# Patient Record
Sex: Female | Born: 1990 | Race: Black or African American | Hispanic: No | Smoking: Former smoker
Health system: Southern US, Community
[De-identification: ages and names within clinical notes are randomized; demographics above are authoritative.]

## PROBLEM LIST (undated history)

## (undated) DIAGNOSIS — A549 Gonococcal infection, unspecified: Secondary | ICD-10-CM

## (undated) DIAGNOSIS — F32A Depression, unspecified: Secondary | ICD-10-CM

## (undated) DIAGNOSIS — A599 Trichomoniasis, unspecified: Secondary | ICD-10-CM

## (undated) DIAGNOSIS — A6000 Herpesviral infection of urogenital system, unspecified: Secondary | ICD-10-CM

## (undated) DIAGNOSIS — B9689 Other specified bacterial agents as the cause of diseases classified elsewhere: Secondary | ICD-10-CM

## (undated) DIAGNOSIS — N760 Acute vaginitis: Secondary | ICD-10-CM

## (undated) DIAGNOSIS — A749 Chlamydial infection, unspecified: Secondary | ICD-10-CM

## (undated) DIAGNOSIS — N739 Female pelvic inflammatory disease, unspecified: Secondary | ICD-10-CM

## (undated) DIAGNOSIS — N83209 Unspecified ovarian cyst, unspecified side: Secondary | ICD-10-CM

## (undated) DIAGNOSIS — F329 Major depressive disorder, single episode, unspecified: Secondary | ICD-10-CM

---
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
## 2010-12-10 LAB — URINALYSIS, ROUTINE W REFLEX MICROSCOPIC
Glucose, UA: NEGATIVE mg/dL
Specific Gravity, Urine: >1.03 — ABNORMAL HIGH (ref 1.005–1.030)
pH: 5.5 (ref 5.0–8.0)

## 2010-12-10 LAB — POCT URINALYSIS DIP (DEVICE)
Ketones, ur: 40 mg/dL — AB
Protein, ur: NEGATIVE mg/dL
Specific Gravity, Urine: 1.025 (ref 1.005–1.030)

## 2010-12-10 LAB — POCT PREGNANCY, URINE
Preg Test, Ur: NEGATIVE
Preg Test, Ur: NEGATIVE

## 2011-01-12 NOTE — Group Therapy Note (Signed)
Debra Boyd, Debra Boyd           ACCOUNT NO.:  [id number]   MEDICAL RECORD NO.:  [id number]          PATIENT TYPE:  WOC   LOCATION:  [facility]                   FACILITY:  [facility]   PHYSICIAN:  Tinnie Gens, MD        DATE OF BIRTH:  1991-01-12   DATE OF SERVICE:  11/23/2007                                  CLINIC NOTE   CHIEF COMPLAINT:  Annual exam.   HISTORY OF PRESENT ILLNESS:  This is a 20 year old female who recently  moved here from [place].  She is self-referred and states that she  needs to start getting gynecological exams.  She has been sexually  active for almost 3 years.  She states she has had 3 partners since she  started having sex.  She is not currently having sex.  She says she uses  condoms most of the time.  She is also interested in birth control pills  today.  She has tried Depo in the past and does not want to try again  secondary to weight gain.  She is interested in oral contraceptive  pills, declines Mirena or Implanon.   OB HISTORY:  No prior pregnancy.   GYN HISTORY:  No history of sexually transmitted diseases.  She has not  had Gardasil.   SOCIAL HISTORY:  1. She does not smoke.  2. She just moved here with her mother.  3. She states she is joining the [employer] this summer.   FAMILY HISTORY:  No known history of ovarian cancer, uterine cancer or  breast cancer.   MEDS:  She is not currently taking any.   PAST MEDICAL HISTORY:  None.   ALLERGIES:  None.   PHYSICAL EXAM:  GENERAL:  She is alert, pleasant, in no acute distress.  RESPIRATORY:  Clear to auscultation bilaterally.  CARDIOVASCULAR:  Regular rate and rhythm, no murmurs, rubs or gallops.  ABDOMEN:  Soft, nontender, positive bowel sounds.  PELVIC:  Normal vaginal mucosa.  Cervix is clean, no discharge.   ASSESSMENT AND PLAN:  A 20 year old female here for yearly exam.  1. Gave prescription for Ortho Tri-Cyclen.  2. Advised starting Gardasil.  Patient wants to go home and talk to  her mother first.  3. Advised getting a regular primary care physician in the area.  4. Counseled heavily on using condoms with each sexual encounter.     ______________________________  Reino Bellis    ______________________________  Tinnie Gens, MD    TA/MEDQ  D:  11/23/2007  T:  11/23/2007  Job:  [id number]

## 2011-01-12 NOTE — Group Therapy Note (Signed)
Debra Boyd, Debra Boyd           ACCOUNT NO.:  [id number]   MEDICAL RECORD NO.:  [id number]          PATIENT TYPE:  WOC   LOCATION:  [facility]                   FACILITY:  [facility]   PHYSICIAN:  Allie Bossier, MD        DATE OF BIRTH:  November 03, 1990   DATE OF SERVICE:  12/26/2008                                  CLINIC NOTE   Crystel is an 20 year old single, black, gravida 0 who is here for her  annual exam.  She has no particular complaints today.  She declines  birth control.  She says that she uses condoms sometimes and that her  boyfriend is resistant to use them.   PAST MEDICAL HISTORY:  She has none.   PAST SURGICAL HISTORY:  None.   SOCIAL HISTORY:  She smokes 3 blunts per day.  She denies tobacco or  alcohol or other drug use.   ALLERGIES:  No known drug allergies.  No latex allergy.   REVIEW OF SYSTEMS:  She lives with her mother.  She is graduating from  [employer] in June of this year.  She has  already enlisted in  [employer] and plans to leave August 9.  She uses condoms sometimes.  Her  Pap smear done last year was normal.   PHYSICAL EXAMINATION:  GENERAL:  Well-nourished, well-hydrated black  female in no apparent distress.  VITAL SIGNS:  Weight 122 pounds, height 5 feet 3, blood pressure 105/69,  pulse 68.  HEENT:  Normal.  BREASTS:  Normal bilaterally.  HEART:  Regular rate and rhythm.  ABDOMEN:  Benign.  No hepatosplenomegaly.  EXTERNAL GENITALIA:  No lesions.  Uterus normal size and shape  anteverted and mobile.  Adnexa nontender.  No masses.   ASSESSMENT/PLAN:  Annual exam.  I did not check a Pap smear due to the  new __________ recommendations.  I did check GC and Chlamydia cultures.  I have strongly advised regular  condom use if she is not using any other form of birth control.    Odetta Pink, MD     MCD/MEDQ  D:  12/26/2008  T:  12/26/2008  Job:  [id number]

## 2011-05-31 LAB — GC/CHLAMYDIA PROBE AMP, GENITAL
Chlamydia, DNA Probe: POSITIVE — AB
GC Probe Amp, Genital: POSITIVE — AB

## 2011-05-31 LAB — POCT PREGNANCY, URINE: Preg Test, Ur: NEGATIVE

## 2011-05-31 LAB — POCT URINALYSIS DIP (DEVICE)
Glucose, UA: NEGATIVE
Nitrite: NEGATIVE
Operator id: 303351
Urobilinogen, UA: 0.2

## 2012-08-19 ENCOUNTER — Encounter (HOSPITAL_COMMUNITY): Payer: Self-pay | Admitting: *Deleted

## 2012-08-19 ENCOUNTER — Emergency Department (HOSPITAL_COMMUNITY)
Admission: EM | Admit: 2012-08-19 | Discharge: 2012-08-19 | Disposition: A | Discharge status: Nursing Home (Includes ICF) | Payer: Self-pay | Source: Home / Self Care | Attending: Emergency Medicine | Admitting: Emergency Medicine

## 2012-08-19 ENCOUNTER — Encounter (HOSPITAL_COMMUNITY): Payer: Self-pay | Admitting: Physical Medicine and Rehabilitation

## 2012-08-19 ENCOUNTER — Emergency Department (HOSPITAL_COMMUNITY): Payer: Medicaid Other

## 2012-08-19 ENCOUNTER — Other Ambulatory Visit (HOSPITAL_COMMUNITY)
Admission: RE | Admit: 2012-08-19 | Discharge: 2012-08-19 | Disposition: A | Discharge status: Home / Self Care | Payer: Medicaid Other | Source: Ambulatory Visit | Attending: Emergency Medicine | Admitting: Emergency Medicine

## 2012-08-19 ENCOUNTER — Emergency Department (HOSPITAL_COMMUNITY)
Admission: EM | Admit: 2012-08-19 | Discharge: 2012-08-20 | Disposition: A | Discharge status: Home / Self Care | Payer: Medicaid Other | Attending: Emergency Medicine | Admitting: Emergency Medicine

## 2012-08-19 DIAGNOSIS — N739 Female pelvic inflammatory disease, unspecified: Secondary | ICD-10-CM | POA: Insufficient documentation

## 2012-08-19 DIAGNOSIS — R110 Nausea: Secondary | ICD-10-CM | POA: Insufficient documentation

## 2012-08-19 DIAGNOSIS — F17200 Nicotine dependence, unspecified, uncomplicated: Secondary | ICD-10-CM | POA: Insufficient documentation

## 2012-08-19 DIAGNOSIS — B9689 Other specified bacterial agents as the cause of diseases classified elsewhere: Secondary | ICD-10-CM | POA: Insufficient documentation

## 2012-08-19 DIAGNOSIS — N730 Acute parametritis and pelvic cellulitis: Secondary | ICD-10-CM

## 2012-08-19 DIAGNOSIS — Z113 Encounter for screening for infections with a predominantly sexual mode of transmission: Secondary | ICD-10-CM | POA: Insufficient documentation

## 2012-08-19 DIAGNOSIS — R1031 Right lower quadrant pain: Secondary | ICD-10-CM

## 2012-08-19 DIAGNOSIS — D72829 Elevated white blood cell count, unspecified: Secondary | ICD-10-CM | POA: Insufficient documentation

## 2012-08-19 DIAGNOSIS — A499 Bacterial infection, unspecified: Secondary | ICD-10-CM | POA: Insufficient documentation

## 2012-08-19 DIAGNOSIS — Z8619 Personal history of other infectious and parasitic diseases: Secondary | ICD-10-CM | POA: Insufficient documentation

## 2012-08-19 DIAGNOSIS — Z3202 Encounter for pregnancy test, result negative: Secondary | ICD-10-CM | POA: Insufficient documentation

## 2012-08-19 DIAGNOSIS — N760 Acute vaginitis: Secondary | ICD-10-CM | POA: Insufficient documentation

## 2012-08-19 LAB — POCT URINALYSIS DIP (DEVICE)
Bilirubin Urine: NEGATIVE
Ketones, ur: NEGATIVE mg/dL
Protein, ur: NEGATIVE mg/dL
Specific Gravity, Urine: 1.025 (ref 1.005–1.030)
pH: 6 (ref 5.0–8.0)

## 2012-08-19 LAB — BASIC METABOLIC PANEL
BUN: 10 mg/dL (ref 6–23)
Chloride: 97 mEq/L (ref 96–112)
Creatinine, Ser: 0.67 mg/dL (ref 0.50–1.10)
GFR calc Af Amer: >90 mL/min (ref >90)
Glucose, Bld: 78 mg/dL (ref 70–99)

## 2012-08-19 LAB — CBC WITH DIFFERENTIAL/PLATELET
Basophils Relative: 0 % (ref 0–1)
HCT: 35.4 % — ABNORMAL LOW (ref 36.0–46.0)
Hemoglobin: 11.7 g/dL — ABNORMAL LOW (ref 12.0–15.0)
Lymphs Abs: 3.1 10*3/uL (ref 0.7–4.0)
MCH: 31.5 pg (ref 26.0–34.0)
MCHC: 33.1 g/dL (ref 30.0–36.0)
Monocytes Absolute: 0.7 10*3/uL (ref 0.1–1.0)
Monocytes Relative: 5 % (ref 3–12)
Neutro Abs: 9.4 10*3/uL — ABNORMAL HIGH (ref 1.7–7.7)
RBC: 3.71 MIL/uL — ABNORMAL LOW (ref 3.87–5.11)

## 2012-08-19 LAB — WET PREP, GENITAL

## 2012-08-19 LAB — POCT PREGNANCY, URINE: Preg Test, Ur: NEGATIVE

## 2012-08-19 MED ORDER — ONDANSETRON HCL 4 MG/2ML IJ SOLN
4.0000 mg | Freq: Once | INTRAMUSCULAR | Status: AC
  Administered 2012-08-19: 4 mg via INTRAVENOUS
  Filled 2012-08-19: qty 2

## 2012-08-19 MED ORDER — MORPHINE SULFATE 4 MG/ML IJ SOLN
4.0000 mg | Freq: Once | INTRAMUSCULAR | Status: AC
  Administered 2012-08-19: 4 mg via INTRAVENOUS
  Filled 2012-08-19: qty 1

## 2012-08-19 MED ORDER — SODIUM CHLORIDE 0.9 % IV BOLUS (SEPSIS)
1000.0000 mL | Freq: Once | INTRAVENOUS | Status: AC
  Administered 2012-08-19: 1000 mL via INTRAVENOUS

## 2012-08-19 NOTE — ED Notes (Signed)
Patient transported to Ultrasound 

## 2012-08-19 NOTE — ED Notes (Signed)
Pt had labs and UA at urgent care.

## 2012-08-19 NOTE — ED Notes (Signed)
Ultrasound called to notify as to the delay in service; pt advised

## 2012-08-19 NOTE — ED Notes (Signed)
Patient complains of right groin pain after playing with her child. Pain started 4 days ago and now radiates up to right abdomen. Patient states she has had nausea for 1 week. Denies vomiting, diarrhea, fever/chills.

## 2012-08-19 NOTE — ED Notes (Signed)
Pt presents to department from [facility] for evaluation of R sided groin pain radiating to R lower abdomen. Ongoing x4 days. States she noticed pain while playing with daughter. 8/10 at the time. States pain begins in groin area and radiates up to RLQ of abdomen. Denies urinary symptoms. Last BM Friday was normal. Also states nausea. She is alert and oriented x4.

## 2012-08-19 NOTE — ED Provider Notes (Signed)
History     CSN: [id number]  Arrival date & time 08/19/12  5621   First MD Initiated Contact with Patient 08/19/12 2005      Chief Complaint  Patient presents with  . Abdominal Pain    (Consider location/radiation/quality/duration/timing/severity/associated sxs/prior treatment) HPI  21 year old female presents complaining of right lower quadrant abdominal tenderness. Patient reports gradual onset of pain to the R groin which radiates to her RLQ.  Described as stabbing, moderate intensity, with nausea and decreased appetite.  Pain worsen with walking or laying on the R side and improves when she sits or lie flat.  Pain first started after having sex with her partner.  Denies fever, chills, cp, sob, back pain, dysuria, rash, vaginal bleeding or vaginal discharge.  Has prior hx of STD.  Is G1P1.  LMP dec 11th.  Is sexually active without using protection, but on birth control.  Has appendix.  Was initially seen at Urgent Care today for same complaint but was sent to ER for further evaluation.    No past medical history on file.  No past surgical history on file.  No family history on file.  History  Substance Use Topics  . Smoking status: Light Tobacco Smoker    Types: Cigarettes  . Smokeless tobacco: Not on file  . Alcohol Use: Yes     Comment: occasional    OB History    Grav Para Term Preterm Abortions TAB SAB Ect Mult Living                  Review of Systems  All other systems reviewed and are negative.    Allergies  Review of patient's allergies indicates no known allergies.  Home Medications  No current outpatient prescriptions on file.  BP 111/71  Pulse 58  Temp 98.6 F (37 C) (Oral)  Resp 18  SpO2 100%  Physical Exam  Nursing note and vitals reviewed. Constitutional: She appears well-developed and well-nourished. No distress.  HENT:  Head: Normocephalic and atraumatic.  Eyes: Conjunctivae normal are normal.  Neck: Normal range of motion. Neck  supple.  Cardiovascular: Normal rate and regular rhythm.   Pulmonary/Chest: Effort normal and breath sounds normal. She exhibits no tenderness.  Abdominal: Soft. There is tenderness in the right lower quadrant. There is no tenderness at McBurney's point and negative Murphy's sign. No hernia. Hernia confirmed negative in the ventral area.  Genitourinary: Uterus normal. There is no rash or lesion on the right labia. There is no rash or lesion on the left labia. Cervix exhibits motion tenderness. Cervix exhibits no discharge and no friability. Right adnexum displays tenderness. Right adnexum displays no mass and no fullness. Left adnexum displays tenderness. Left adnexum displays no mass and no fullness. No erythema, tenderness or bleeding around the vagina. Vaginal discharge found.       Chaperone present:  Lymphadenopathy:       Right: No inguinal adenopathy present.       Left: No inguinal adenopathy present.    ED Course  Procedures (including critical care time)  Results for orders placed during the hospital encounter of 08/19/12  POCT URINALYSIS DIP (DEVICE)      Component Value Range   Glucose, UA NEGATIVE  NEGATIVE mg/dL   Bilirubin Urine NEGATIVE  NEGATIVE   Ketones, ur NEGATIVE  NEGATIVE mg/dL   Specific Gravity, Urine 1.025  1.005 - 1.030   Hgb urine dipstick TRACE (*) NEGATIVE   pH 6.0  5.0 - 8.0   Protein,  ur NEGATIVE  NEGATIVE mg/dL   Urobilinogen, UA 1.0  0.0 - 1.0 mg/dL   Nitrite NEGATIVE  NEGATIVE   Leukocytes, UA NEGATIVE  NEGATIVE  POCT PREGNANCY, URINE      Component Value Range   Preg Test, Ur NEGATIVE  NEGATIVE  CBC WITH DIFFERENTIAL      Component Value Range   WBC 13.6 (*) 4.0 - 10.5 K/uL   RBC 3.71 (*) 3.87 - 5.11 MIL/uL   Hemoglobin 11.7 (*) 12.0 - 15.0 g/dL   HCT 45.4 (*) 09.8 - 11.9 %   MCV 95.4  78.0 - 100.0 fL   MCH 31.5  26.0 - 34.0 pg   MCHC 33.1  30.0 - 36.0 g/dL   RDW 14.7  82.9 - 56.2 %   Platelets 318  150 - 400 K/uL   Neutrophils Relative 69   43 - 77 %   Neutro Abs 9.4 (*) 1.7 - 7.7 K/uL   Lymphocytes Relative 23  12 - 46 %   Lymphs Abs 3.1  0.7 - 4.0 K/uL   Monocytes Relative 5  3 - 12 %   Monocytes Absolute 0.7  0.1 - 1.0 K/uL   Eosinophils Relative 2  0 - 5 %   Eosinophils Absolute 0.3  0.0 - 0.7 K/uL   Basophils Relative 0  0 - 1 %   Basophils Absolute 0.0  0.0 - 0.1 K/uL   No results found.  1. Bacterial vaginosis  MDM  Pt presents with RLQ abd pain, reproducible on exam.  She has an elevated WBC of 13. She is tender on pelvic exam, with CMT.    Pelvic and transvaginal US ordered.   Pt otherwise appears nontoxic.    10:13 PM Will obtain US pelvis complete, [place] art/ven flow abd pelv doppler and US transvaginal non-OB for further evaluation.  If all are negative, will consider abd/pelvis CT w/CM to r/o appy.  Pain medication given.    12:23 AM Ultrasound with findings suggestive of pelvic inflammatory disease. The appendix was not visualized. I suspect her symptoms is likely PID and less likely to be appendicitis. Patient will be treated with Rocephin and Zithromax. Chest evidence of bacterial vaginosis, and will be treated with Flagyl. The patient is stable enough to be discharged as she is afebrile and her vital signs stable. Patient is able to tolerates by mouth. I suggest patient to avoid all sexual activities until symptoms improve. Patient should notify partner for an evaluation as he is at risk for STD. Strict return precautions discussed.  BP 126/62  Pulse 59  Temp 98.3 F (36.8 C) (Oral)  Resp 18  SpO2 100%  I have reviewed nursing notes and vital signs. I personally reviewed the imaging tests through PACS system  I reviewed available ER/hospitalization records thought the EMR     Fayrene Helper, [place] 08/20/12 1308

## 2012-08-19 NOTE — ED Provider Notes (Signed)
Chief Complaint  Patient presents with  . Groin Pain    History of Present Illness:    The patient is a 21 year old female with a four-day history of intermittent right lower quadrant pain that radiates towards the epigastrium. Pains can last for an hour at a time and they're stabbing and rated 8/10 in intensity. The pain radiates down the right leg. It's worse if she walks, lies on her side, or abdomen is shaken and better with sitting. She's felt nauseated and anorexia but not vomited. She denies any fever or chills. She's been somewhat constipated and. No blood in the stool. No urinary symptoms. Her menses have been irregular. Last menstrual period was December 11 and was very light. She is sexually active and is not on birth control.  Review of Systems:  Other than noted above, the patient denies any of the following symptoms: Constitutional:  No fever, chills, fatigue, weight loss or anorexia. Lungs:  No cough or shortness of breath. Heart:  No chest pain, palpitations, syncope or edema.  No cardiac history. Abdomen:  No nausea, vomiting, hematememesis, melena, diarrhea, or hematochezia. GU:  No dysuria, frequency, urgency, or hematuria. Gyn:  No vaginal discharge, itching, abnormal bleeding, dyspareunia, or pelvic pain.  PMFSH:  Past medical history, family history, social history, meds, and allergies were reviewed along with nurse's notes.  No prior abdominal surgeries, past history of GI problems, STDs or GYN problems.  No history of aspirin or NSAID use.  No excessive alcohol intake.  Physical Exam:   Vital signs:  BP 116/63  Pulse 58  Temp 98.3 F (36.8 C) (Oral)  Resp 14  SpO2 100% Gen:  Alert, oriented, in no distress. Lungs:  Breath sounds clear and equal bilaterally.  No wheezes, rales or rhonchi. Heart:  Regular rhythm.  No gallops or murmers.   Abdomen:  Abdomen is soft, flat, nondistended. There is tenderness to palpation in the right lower quadrant with guarding but no  rebound. She also has tenderness to palpation in the right upper quadrant, epigastrium, periumbilical area, and left upper quadrant but not in the left lower quadrant. No organomegaly or mass. Bowel sounds were decreased. Pelvic:  Normal external genitalia. Vaginal and cervical mucosa were normal. There is marketed pain on cervical motion. Uterus is normal in size and shape but was markedly tender tender to palpation. There is marked at tenderness to palpation over both adnexa without any masses. Skin:  Clear, warm and dry.  No rash.  Labs:   Results for orders placed during the hospital encounter of 08/19/12  POCT URINALYSIS DIP (DEVICE)      Component Value Range   Glucose, UA NEGATIVE  NEGATIVE mg/dL   Bilirubin Urine NEGATIVE  NEGATIVE   Ketones, ur NEGATIVE  NEGATIVE mg/dL   Specific Gravity, Urine 1.025  1.005 - 1.030   Hgb urine dipstick TRACE (*) NEGATIVE   pH 6.0  5.0 - 8.0   Protein, ur NEGATIVE  NEGATIVE mg/dL   Urobilinogen, UA 1.0  0.0 - 1.0 mg/dL   Nitrite NEGATIVE  NEGATIVE   Leukocytes, UA NEGATIVE  NEGATIVE  POCT PREGNANCY, URINE      Component Value Range   Preg Test, Ur NEGATIVE  NEGATIVE  CBC WITH DIFFERENTIAL      Component Value Range   WBC 13.6 (*) 4.0 - 10.5 K/uL   RBC 3.71 (*) 3.87 - 5.11 MIL/uL   Hemoglobin 11.7 (*) 12.0 - 15.0 g/dL   HCT 45.4 (*) 09.8 - 11.9 %  MCV 95.4  78.0 - 100.0 fL   MCH 31.5  26.0 - 34.0 pg   MCHC 33.1  30.0 - 36.0 g/dL   RDW 40.9  81.1 - 91.4 %   Platelets 318  150 - 400 K/uL   Neutrophils Relative 69  43 - 77 %   Neutro Abs 9.4 (*) 1.7 - 7.7 K/uL   Lymphocytes Relative 23  12 - 46 %   Lymphs Abs 3.1  0.7 - 4.0 K/uL   Monocytes Relative 5  3 - 12 %   Monocytes Absolute 0.7  0.1 - 1.0 K/uL   Eosinophils Relative 2  0 - 5 %   Eosinophils Absolute 0.3  0.0 - 0.7 K/uL   Basophils Relative 0  0 - 1 %   Basophils Absolute 0.0  0.0 - 0.1 K/uL    Assessment:  The encounter diagnosis was RLQ abdominal pain.  Differential  diagnosis was appendicitis versus PID.  Plan:   1.  The following meds were prescribed:   New Prescriptions   No medications on file   2.  The patient was transferred to the emergency department via shuttle.   Reuben Likes, MD 08/19/12 [phone]

## 2012-08-20 MED ORDER — AZITHROMYCIN 250 MG PO TABS
1000.0000 mg | ORAL_TABLET | Freq: Once | ORAL | Status: AC
  Administered 2012-08-20: 1000 mg via ORAL
  Filled 2012-08-20: qty 4

## 2012-08-20 MED ORDER — METRONIDAZOLE 500 MG PO TABS: 500.0000 mg | ORAL_TABLET | Freq: Two times a day (BID) | ORAL | Status: DC

## 2012-08-20 MED ORDER — ONDANSETRON HCL 4 MG PO TABS: 4.0000 mg | ORAL_TABLET | Freq: Four times a day (QID) | ORAL | Status: DC

## 2012-08-20 MED ORDER — METRONIDAZOLE 500 MG PO TABS
500.0000 mg | ORAL_TABLET | Freq: Once | ORAL | Status: AC
  Administered 2012-08-20: 500 mg via ORAL
  Filled 2012-08-20: qty 1

## 2012-08-20 MED ORDER — MORPHINE SULFATE 4 MG/ML IJ SOLN
4.0000 mg | Freq: Once | INTRAMUSCULAR | Status: AC
  Administered 2012-08-20: 4 mg via INTRAVENOUS
  Filled 2012-08-20: qty 1

## 2012-08-20 MED ORDER — DEXTROSE 5 % IV SOLN
1.0000 g | Freq: Once | INTRAVENOUS | Status: AC
  Administered 2012-08-20: 01:00:00 via INTRAVENOUS
  Filled 2012-08-20: qty 10

## 2012-08-20 MED ORDER — IBUPROFEN 600 MG PO TABS: 600.0000 mg | ORAL_TABLET | Freq: Four times a day (QID) | ORAL | Status: DC | PRN

## 2012-08-20 MED ORDER — IBUPROFEN 800 MG PO TABS: 800.0000 mg | ORAL_TABLET | Freq: Three times a day (TID) | ORAL | Status: DC

## 2012-08-20 NOTE — ED Provider Notes (Signed)
Medical screening examination/treatment/procedure(s) were performed by non-physician practitioner and as supervising physician I was immediately available for consultation/collaboration.   Carleene Cooper III, MD 08/20/12 1310

## 2012-08-21 LAB — GC/CHLAMYDIA PROBE AMP
CT Probe RNA: NEGATIVE
GC Probe RNA: NEGATIVE

## 2012-10-08 NOTE — ED Notes (Signed)
GC/Chlamydia neg., Affirm: Candida and Trich neg. Gardnerella pos.  Pt. was transferred to ED and given Rx. Metronidazole by Fayrene Helper PA on 12/22. Vassie Moselle 10/08/2012

## 2013-02-23 ENCOUNTER — Inpatient Hospital Stay (HOSPITAL_COMMUNITY)
Admission: AD | Admit: 2013-02-23 | Discharge: 2013-02-23 | Disposition: A | Discharge status: Home / Self Care | Payer: Medicaid Other | Source: Ambulatory Visit | Attending: Obstetrics | Admitting: Obstetrics

## 2013-02-23 ENCOUNTER — Inpatient Hospital Stay (HOSPITAL_COMMUNITY): Payer: Medicaid Other

## 2013-02-23 ENCOUNTER — Encounter (HOSPITAL_COMMUNITY): Payer: Self-pay | Admitting: *Deleted

## 2013-02-23 DIAGNOSIS — A499 Bacterial infection, unspecified: Secondary | ICD-10-CM

## 2013-02-23 DIAGNOSIS — N760 Acute vaginitis: Secondary | ICD-10-CM

## 2013-02-23 DIAGNOSIS — B9689 Other specified bacterial agents as the cause of diseases classified elsewhere: Secondary | ICD-10-CM | POA: Insufficient documentation

## 2013-02-23 DIAGNOSIS — N949 Unspecified condition associated with female genital organs and menstrual cycle: Secondary | ICD-10-CM | POA: Insufficient documentation

## 2013-02-23 DIAGNOSIS — H6120 Impacted cerumen, unspecified ear: Secondary | ICD-10-CM | POA: Insufficient documentation

## 2013-02-23 DIAGNOSIS — R1031 Right lower quadrant pain: Secondary | ICD-10-CM | POA: Insufficient documentation

## 2013-02-23 HISTORY — DX: Depression, unspecified: F32.A

## 2013-02-23 HISTORY — DX: Chlamydial infection, unspecified: A74.9

## 2013-02-23 HISTORY — DX: Gonococcal infection, unspecified: A54.9

## 2013-02-23 HISTORY — DX: Female pelvic inflammatory disease, unspecified: N73.9

## 2013-02-23 HISTORY — DX: Trichomoniasis, unspecified: A59.9

## 2013-02-23 HISTORY — DX: Major depressive disorder, single episode, unspecified: F32.9

## 2013-02-23 LAB — URINALYSIS, ROUTINE W REFLEX MICROSCOPIC
Bilirubin Urine: NEGATIVE
Glucose, UA: NEGATIVE mg/dL
Hgb urine dipstick: NEGATIVE
Ketones, ur: NEGATIVE mg/dL
pH: 6.5 (ref 5.0–8.0)

## 2013-02-23 LAB — POCT PREGNANCY, URINE: Preg Test, Ur: NEGATIVE

## 2013-02-23 LAB — CBC
MCHC: 33.6 g/dL (ref 30.0–36.0)
RDW: 11.7 % (ref 11.5–15.5)

## 2013-02-23 LAB — WET PREP, GENITAL
Trich, Wet Prep: NONE SEEN
Yeast Wet Prep HPF POC: NONE SEEN

## 2013-02-23 MED ORDER — METRONIDAZOLE 500 MG PO TABS: 500.0000 mg | ORAL_TABLET | Freq: Two times a day (BID) | ORAL | Status: DC

## 2013-02-23 NOTE — MAU Note (Signed)
Lower abd pain x 3 days, hx of PID in December, pain feels similar.  Denies bleeding, scant discharge.  L ear clogged x 1 month.

## 2013-02-23 NOTE — MAU Provider Note (Signed)
History     CSN: [id number]  Arrival date and time: 02/23/13 1730   First Provider Initiated Contact with Patient 02/23/13 1827      Chief Complaint  Patient presents with  . Abdominal Pain   HPI 22 y.o. G1P1001 here with right pelvic pain x 3 days, + discharge, no bleeding. Patient's last menstrual period was 02/12/2013. H/O PID last year, completed prescribed meds.   Also c/o painful "stopped up" left ear x 1 month.   Past Medical History  Diagnosis Date  . Depression   . Pelvic inflammatory disease   . Gonorrhea   . Chlamydia   . Trichomonas     Past Surgical History  Procedure Laterality Date  . Cesarean section      History reviewed. No pertinent family history.  History  Substance Use Topics  . Smoking status: Light Tobacco Smoker    Types: Cigarettes  . Smokeless tobacco: Not on file  . Alcohol Use: No    Allergies: No Known Allergies  Prescriptions prior to admission  Medication Sig Dispense Refill  . ibuprofen (ADVIL,MOTRIN) 600 MG tablet Take 1 tablet (600 mg total) by mouth every 6 (six) hours as needed for pain.  30 tablet  0  . QUEtiapine (SEROQUEL) 50 MG tablet Take 50-100 mg by mouth at bedtime.        Review of Systems  Constitutional: Negative.   HENT: Positive for ear pain.   Respiratory: Negative.   Cardiovascular: Negative.   Gastrointestinal: Positive for abdominal pain. Negative for nausea, vomiting, diarrhea and constipation.  Genitourinary: Negative for dysuria, urgency, frequency, hematuria and flank pain.       Negative for vaginal bleeding, + discharge   Musculoskeletal: Negative.   Neurological: Negative.   Psychiatric/Behavioral: Negative.    Physical Exam   Blood pressure 114/66, pulse 77, temperature 98.9 F (37.2 C), temperature source Oral, resp. rate 18, last menstrual period 02/12/2013.  Physical Exam  Nursing note and vitals reviewed. Constitutional: She is oriented to person, place, and time. She appears  well-developed and well-nourished. No distress.  HENT:  Head: Normocephalic and atraumatic.  Left Ear: External ear normal.  Left ear with cerumen impaction   Cardiovascular: Normal rate and regular rhythm.   Respiratory: Effort normal and breath sounds normal. No respiratory distress.  GI: Soft. She exhibits no distension and no mass. There is no tenderness. There is no rebound and no guarding.  Genitourinary: There is no rash or lesion on the right labia. There is no rash or lesion on the left labia. Uterus is not deviated, not enlarged, not fixed and not tender. Cervix exhibits no motion tenderness, no discharge and no friability. Right adnexum displays no mass, no tenderness and no fullness. Left adnexum displays no mass, no tenderness and no fullness. No erythema, tenderness or bleeding around the vagina. No vaginal discharge found.  Neurological: She is alert and oriented to person, place, and time.  Skin: Skin is warm and dry.  Psychiatric: She has a normal mood and affect.    MAU Course  Procedures  Results for orders placed during the hospital encounter of 02/23/13 (from the past 24 hour(s))  URINALYSIS, ROUTINE W REFLEX MICROSCOPIC     Status: None   Collection Time    02/23/13  5:50 PM      Result Value Range   Color, Urine YELLOW  YELLOW   APPearance CLEAR  CLEAR   Specific Gravity, Urine 1.025  1.005 - 1.030   pH 6.5  5.0 - 8.0   Glucose, UA NEGATIVE  NEGATIVE mg/dL   Hgb urine dipstick NEGATIVE  NEGATIVE   Bilirubin Urine NEGATIVE  NEGATIVE   Ketones, ur NEGATIVE  NEGATIVE mg/dL   Protein, ur NEGATIVE  NEGATIVE mg/dL   Urobilinogen, UA 1.0  0.0 - 1.0 mg/dL   Nitrite NEGATIVE  NEGATIVE   Leukocytes, UA NEGATIVE  NEGATIVE  POCT PREGNANCY, URINE     Status: None   Collection Time    02/23/13  6:11 PM      Result Value Range   Preg Test, Ur NEGATIVE  NEGATIVE  WET PREP, GENITAL     Status: Abnormal   Collection Time    02/23/13  6:20 PM      Result Value Range    Yeast Wet Prep HPF POC NONE SEEN  NONE SEEN   Trich, Wet Prep NONE SEEN  NONE SEEN   Clue Cells Wet Prep HPF POC MANY (*) NONE SEEN   WBC, Wet Prep HPF POC FEW (*) NONE SEEN  CBC     Status: Abnormal   Collection Time    02/23/13  6:51 PM      Result Value Range   WBC 10.0  4.0 - 10.5 K/uL   RBC 3.42 (*) 3.87 - 5.11 MIL/uL   Hemoglobin 11.0 (*) 12.0 - 15.0 g/dL   HCT 47.8 (*) 29.5 - 62.1 %   MCV 95.6  78.0 - 100.0 fL   MCH 32.2  26.0 - 34.0 pg   MCHC 33.6  30.0 - 36.0 g/dL   RDW 30.8  65.7 - 84.6 %   Platelets 260  150 - 400 K/uL   US Transvaginal Non-ob  02/23/2013   *RADIOLOGY REPORT*  Clinical Data: Right-sided adnexal tenderness, history of pelvic pain, history of PID  TRANSABDOMINAL AND TRANSVAGINAL ULTRASOUND OF PELVIS Technique:  Both transabdominal and transvaginal ultrasound examinations of the pelvis were performed. Transabdominal technique was performed for global imaging of the pelvis including uterus, ovaries, adnexal regions, and pelvic cul-de-sac.  It was necessary to proceed with endovaginal exam following the transabdominal exam to visualize the bilateral ovaries and adnexa.  Comparison:  Pelvic ultrasound - 08/19/2012  Findings:  Uterus: Normal in size measuring 9.2 x 3.6 x 4.1 cm.  The uterus is anteverted.  No discrete uterine mass.  Endometrium: Normal in thickness and appearance  Right ovary:  Normal in size measuring 2.8 x 2.0 x 2.1 cm. Incidental note is made of a sub centimeter (approximately 0.9 cm) hypoechoic cyst within the right ovary, favored to represent an involuting physiologic cyst.  Otherwise, no discrete right-sided ovarian/adnexal lesions.  Left ovary: Normal in size measuring 3.1 x 1.6 x 1.8 cm.  Several peripheral follicles are incidentally noted within the left ovary.  Other findings: A trace amount of free fluid is noted within the pelvic cul-de-sac.  IMPRESSION:  No explanation for patient's right lower quadrant abdominal pain. Specifically, no evidence of  pelvic mass or other significant abnormality.   Original Report Authenticated By: Tacey Ruiz, MD   US Pelvis Complete  02/23/2013   *RADIOLOGY REPORT*  Clinical Data: Right-sided adnexal tenderness, history of pelvic pain, history of PID  TRANSABDOMINAL AND TRANSVAGINAL ULTRASOUND OF PELVIS Technique:  Both transabdominal and transvaginal ultrasound examinations of the pelvis were performed. Transabdominal technique was performed for global imaging of the pelvis including uterus, ovaries, adnexal regions, and pelvic cul-de-sac.  It was necessary to proceed with endovaginal exam following the transabdominal exam to visualize the  bilateral ovaries and adnexa.  Comparison:  Pelvic ultrasound - 08/19/2012  Findings:  Uterus: Normal in size measuring 9.2 x 3.6 x 4.1 cm.  The uterus is anteverted.  No discrete uterine mass.  Endometrium: Normal in thickness and appearance  Right ovary:  Normal in size measuring 2.8 x 2.0 x 2.1 cm. Incidental note is made of a sub centimeter (approximately 0.9 cm) hypoechoic cyst within the right ovary, favored to represent an involuting physiologic cyst.  Otherwise, no discrete right-sided ovarian/adnexal lesions.  Left ovary: Normal in size measuring 3.1 x 1.6 x 1.8 cm.  Several peripheral follicles are incidentally noted within the left ovary.  Other findings: A trace amount of free fluid is noted within the pelvic cul-de-sac.  IMPRESSION:  No explanation for patient's right lower quadrant abdominal pain. Specifically, no evidence of pelvic mass or other significant abnormality.   Original Report Authenticated By: Tacey Ruiz, MD    Assessment and Plan   1. BV (bacterial vaginosis)   Flagyl for bv, f/u with worsening pain  May try liquid stool softener or hydrogen peroxide in ear to loosen cerumen impaction, if no relief, f/u with urgent care or PCP.     Medication List    TAKE these medications       ibuprofen 600 MG tablet  Commonly known as:  ADVIL,MOTRIN  Take  1 tablet (600 mg total) by mouth every 6 (six) hours as needed for pain.     metroNIDAZOLE 500 MG tablet  Commonly known as:  FLAGYL  Take 1 tablet (500 mg total) by mouth 2 (two) times daily.     QUEtiapine 50 MG tablet  Commonly known as:  SEROQUEL  Take 50-100 mg by mouth at bedtime.            Follow-up Information   Follow up with MARSHALL,BERNARD A, MD. (As needed)    Contact information:   [address] [place] [address] [phone]         Jodi Criscuolo 02/23/2013, 8:00 PM

## 2013-03-14 ENCOUNTER — Emergency Department (HOSPITAL_COMMUNITY)
Admission: EM | Admit: 2013-03-14 | Discharge: 2013-03-14 | Disposition: A | Discharge status: Home / Self Care | Payer: Medicaid Other | Source: Home / Self Care | Attending: Emergency Medicine | Admitting: Emergency Medicine

## 2013-03-14 ENCOUNTER — Encounter (HOSPITAL_COMMUNITY): Payer: Self-pay

## 2013-03-14 DIAGNOSIS — J020 Streptococcal pharyngitis: Secondary | ICD-10-CM

## 2013-03-14 LAB — POCT RAPID STREP A: Streptococcus, Group A Screen (Direct): POSITIVE — AB

## 2013-03-14 MED ORDER — AMOXICILLIN 500 MG PO CAPS: 500.0000 mg | ORAL_CAPSULE | Freq: Three times a day (TID) | ORAL | Status: AC

## 2013-03-14 MED ORDER — ACETAMINOPHEN 500 MG PO TABS
1000.0000 mg | ORAL_TABLET | Freq: Once | ORAL | Status: AC
  Administered 2013-03-14: 1000 mg via ORAL

## 2013-03-14 MED ORDER — ACETAMINOPHEN 325 MG PO TABS
ORAL_TABLET | ORAL | Status: AC
  Filled 2013-03-14: qty 3

## 2013-03-14 NOTE — ED Provider Notes (Signed)
History    CSN: [id number] Arrival date & time 03/14/13  1805  First MD Initiated Contact with Patient 03/14/13 1816     Chief Complaint  Patient presents with  . Sore Throat   (Consider location/radiation/quality/duration/timing/severity/associated sxs/prior Treatment) HPI Comments: Patient presents urgent care describing a sore throat for 2 days. Hurts to swallow she's been using salt water gargles. Has been having fevers as well. Denies any respiratory symptoms such as nasal congestion runny nose or cough.  Patient is a 22 y.o. female presenting with pharyngitis. The history is provided by the patient.  Sore Throat This is a new problem. The current episode started 2 days ago. The problem has been gradually worsening. Pertinent negatives include no abdominal pain and no headaches. Nothing relieves the symptoms. She has tried nothing for the symptoms. The treatment provided no relief.   Past Medical History  Diagnosis Date  . Depression   . Pelvic inflammatory disease   . Gonorrhea   . Chlamydia   . Trichomonas    Past Surgical History  Procedure Laterality Date  . Cesarean section     History reviewed. No pertinent family history. History  Substance Use Topics  . Smoking status: Light Tobacco Smoker    Types: Cigarettes  . Smokeless tobacco: Not on file  . Alcohol Use: No   OB History   Grav Para Term Preterm Abortions TAB SAB Ect Mult Living   1 1 1       1      Review of Systems  Constitutional: Positive for fever, activity change and appetite change. Negative for chills, diaphoresis and fatigue.  HENT: Positive for sore throat. Negative for ear pain, congestion, rhinorrhea, trouble swallowing and voice change.   Gastrointestinal: Negative for abdominal pain.  Skin: Negative for color change, pallor and rash.  Neurological: Negative for headaches.    Allergies  Review of patient's allergies indicates no known allergies.  Home Medications   Current  Outpatient Rx  Name  Route  Sig  Dispense  Refill  . amoxicillin (AMOXIL) 500 MG capsule   Oral   Take 1 capsule (500 mg total) by mouth 3 (three) times daily.   30 capsule   0   . ibuprofen (ADVIL,MOTRIN) 600 MG tablet   Oral   Take 1 tablet (600 mg total) by mouth every 6 (six) hours as needed for pain.   30 tablet   0   . metroNIDAZOLE (FLAGYL) 500 MG tablet   Oral   Take 1 tablet (500 mg total) by mouth 2 (two) times daily.   14 tablet   0   . QUEtiapine (SEROQUEL) 50 MG tablet   Oral   Take 50-100 mg by mouth at bedtime.          BP 116/79  Pulse 96  Temp(Src) 101.3 F (38.5 C) (Oral)  Resp 19  SpO2 95%  LMP 02/08/2013 Physical Exam  Nursing note and vitals reviewed. Constitutional: She appears well-developed and well-nourished.  HENT:  Head: Normocephalic.  Mouth/Throat: Oropharyngeal exudate present.  Eyes: Conjunctivae are normal. No scleral icterus.  Neck: Neck supple.  Pulmonary/Chest: Effort normal and breath sounds normal.  Abdominal: Soft.  Lymphadenopathy:    She has cervical adenopathy.  Neurological: She is alert.  Skin: No rash noted. No erythema.    ED Course  Procedures (including critical care time) Labs Reviewed  POCT RAPID STREP A ([facility] ONLY) - Abnormal; Notable for the following:    Streptococcus, Group A  Screen (Direct) POSITIVE (*)    All other components within normal limits   No results found. 1. Streptococcal pharyngitis     MDM  Uncomplicated streptococcal pharyngitis  Discharge Medication List as of 03/14/2013  6:38 PM    START taking these medications   Details  amoxicillin (AMOXIL) 500 MG capsule Take 1 capsule (500 mg total) by mouth 3 (three) times daily., Starting 03/14/2013, Last dose on Sat 03/24/13, Print        Jimmie Molly, MD 03/14/13 [phone]

## 2013-03-14 NOTE — ED Notes (Signed)
C/o sore throat x past couple of days, using warm salt water gargle

## 2013-09-28 ENCOUNTER — Encounter (HOSPITAL_COMMUNITY): Payer: Self-pay | Admitting: *Deleted

## 2013-09-28 ENCOUNTER — Inpatient Hospital Stay (HOSPITAL_COMMUNITY): Payer: Medicaid Other

## 2013-09-28 ENCOUNTER — Inpatient Hospital Stay (HOSPITAL_COMMUNITY)
Admission: AD | Admit: 2013-09-28 | Discharge: 2013-09-28 | Disposition: A | Discharge status: Home / Self Care | Payer: Medicaid Other | Source: Ambulatory Visit | Attending: Obstetrics | Admitting: Obstetrics

## 2013-09-28 DIAGNOSIS — N83299 Other ovarian cyst, unspecified side: Secondary | ICD-10-CM

## 2013-09-28 DIAGNOSIS — R1031 Right lower quadrant pain: Secondary | ICD-10-CM | POA: Insufficient documentation

## 2013-09-28 DIAGNOSIS — B9689 Other specified bacterial agents as the cause of diseases classified elsewhere: Secondary | ICD-10-CM | POA: Insufficient documentation

## 2013-09-28 DIAGNOSIS — N949 Unspecified condition associated with female genital organs and menstrual cycle: Secondary | ICD-10-CM | POA: Insufficient documentation

## 2013-09-28 DIAGNOSIS — N83209 Unspecified ovarian cyst, unspecified side: Secondary | ICD-10-CM

## 2013-09-28 DIAGNOSIS — N760 Acute vaginitis: Secondary | ICD-10-CM | POA: Insufficient documentation

## 2013-09-28 DIAGNOSIS — F17200 Nicotine dependence, unspecified, uncomplicated: Secondary | ICD-10-CM | POA: Insufficient documentation

## 2013-09-28 DIAGNOSIS — A499 Bacterial infection, unspecified: Secondary | ICD-10-CM | POA: Insufficient documentation

## 2013-09-28 LAB — URINALYSIS, ROUTINE W REFLEX MICROSCOPIC
Bilirubin Urine: NEGATIVE
Glucose, UA: NEGATIVE mg/dL
HGB URINE DIPSTICK: NEGATIVE
Ketones, ur: NEGATIVE mg/dL
Leukocytes, UA: NEGATIVE
Nitrite: NEGATIVE
PH: 5.5 (ref 5.0–8.0)
Protein, ur: NEGATIVE mg/dL
UROBILINOGEN UA: 0.2 mg/dL (ref 0.0–1.0)

## 2013-09-28 LAB — CBC WITH DIFFERENTIAL/PLATELET
BASOS ABS: 0 10*3/uL (ref 0.0–0.1)
Basophils Relative: 0 % (ref 0–1)
EOS ABS: 0.2 10*3/uL (ref 0.0–0.7)
Eosinophils Relative: 2 % (ref 0–5)
HCT: 34.4 % — ABNORMAL LOW (ref 36.0–46.0)
Hemoglobin: 11.9 g/dL — ABNORMAL LOW (ref 12.0–15.0)
Lymphocytes Relative: 23 % (ref 12–46)
Lymphs Abs: 2.2 10*3/uL (ref 0.7–4.0)
MCH: 32.6 pg (ref 26.0–34.0)
MCHC: 34.6 g/dL (ref 30.0–36.0)
MCV: 94.2 fL (ref 78.0–100.0)
Monocytes Absolute: 0.8 10*3/uL (ref 0.1–1.0)
Monocytes Relative: 8 % (ref 3–12)
NEUTROS ABS: 6.5 10*3/uL (ref 1.7–7.7)
Neutrophils Relative %: 67 % (ref 43–77)
PLATELETS: 210 10*3/uL (ref 150–400)
RBC: 3.65 MIL/uL — ABNORMAL LOW (ref 3.87–5.11)
RDW: 11.6 % (ref 11.5–15.5)
WBC: 9.7 10*3/uL (ref 4.0–10.5)

## 2013-09-28 LAB — WET PREP, GENITAL
TRICH WET PREP: NONE SEEN
YEAST WET PREP: NONE SEEN

## 2013-09-28 LAB — POCT PREGNANCY, URINE: Preg Test, Ur: NEGATIVE

## 2013-09-28 MED ORDER — KETOROLAC TROMETHAMINE 60 MG/2ML IM SOLN
60.0000 mg | Freq: Once | INTRAMUSCULAR | Status: AC
  Administered 2013-09-28: 60 mg via INTRAMUSCULAR
  Filled 2013-09-28: qty 2

## 2013-09-28 MED ORDER — METRONIDAZOLE 500 MG PO TABS: 500.0000 mg | ORAL_TABLET | Freq: Two times a day (BID) | ORAL | Status: DC

## 2013-09-28 NOTE — MAU Note (Signed)
Pt states here for pain on right lower abdominal area only. Pain woke her up, was unable to get comfortable. Lasted x2-3 hours. Called Dr. Clearance CootsHarper who told her to come here for further eval. Denies abnormal vaginal discharge or bleeding. Does note burning when voiding for one month intermittently. Saw Dr. Gaynell FaceMarshall last week who said everything was fine.

## 2013-09-28 NOTE — MAU Provider Note (Signed)
History     CSN: [id number]  Arrival date and time: 09/28/13 1007   First Provider Initiated Contact with Patient 09/28/13 1307      Chief Complaint  Patient presents with  . Abdominal Pain   HPI Debra Boyd is 23 y.o. G1P1001 Unknown weeks presenting with sudden onset of sharp right sided pain that moved to her stomach, back ,pelvic and right leg that lasted 2-3 hrs.  Less stabbing now but there and is dull on the right. This pain has been there for several months-intermittent.  Can occur with sitting or walking.  Nothing in particular makes it worse or better.  Ibuprofen and Midal helps for a short time.  She called Dr. Clearance CootsHarper, who is on call for Dr. Gaynell FaceMarshall, and he told her to come here to be evaluated.  LMP 09-07-13-normal for last few months.  Sexually active 1 partner planning a pregnancy.  Had exam and annual pap 09-20-13, she did not discuss this pain with him.  She "passed it off" as ovulation or because she had PID last year.    Past Medical History  Diagnosis Date  . Depression   . Pelvic inflammatory disease   . Gonorrhea   . Chlamydia   . Trichomonas     Past Surgical History  Procedure Laterality Date  . Cesarean section      Family History  Problem Relation Age of Onset  . Diabetes Maternal Aunt   . Kidney disease Paternal Aunt   . Heart disease Maternal Grandmother   . Hypertension Maternal Grandmother   . Hyperlipidemia Maternal Grandmother   . Lupus Maternal Aunt     History  Substance Use Topics  . Smoking status: Light Tobacco Smoker    Types: Cigarettes  . Smokeless tobacco: Never Used  . Alcohol Use: No    Allergies: No Known Allergies  Prescriptions prior to admission  Medication Sig Dispense Refill  . QUEtiapine (SEROQUEL) 50 MG tablet Take 50-100 mg by mouth 3 times/day as needed-between meals & bedtime (for sleep).         Review of Systems  Constitutional: Negative for fever and chills.  Gastrointestinal: Positive for abdominal  pain. Negative for nausea and vomiting.  Genitourinary: Positive for dysuria. Negative for urgency, frequency and hematuria.       Neg for vaginal discharge or bleeding  Neurological: Negative for headaches.   Physical Exam   Blood pressure 110/67, pulse 55, temperature 98.1 F (36.7 C), temperature source Oral, resp. rate 18, height 5\' 2"  (1.575 m), weight 131 lb 2 oz (59.478 kg), last menstrual period 09/07/2013.  Physical Exam  Constitutional: She is oriented to person, place, and time. She appears well-developed and well-nourished. No distress.  HENT:  Head: Normocephalic.  Neck: Normal range of motion.  Cardiovascular: Normal rate.   Respiratory: Effort normal.  GI: Soft. She exhibits no distension and no mass. There is tenderness (moderate tenderness on the right and left mid abdomen. and mild RLQ). There is no rebound and no guarding.  Genitourinary: There is no rash, tenderness or lesion on the right labia. There is no rash, tenderness or lesion on the left labia. Uterus is not enlarged and not tender. Right adnexum displays tenderness (mild). Right adnexum displays no mass and no fullness. Left adnexum displays no mass, no tenderness and no fullness. No erythema, tenderness or bleeding around the vagina. Vaginal discharge (small amount of white discharge without odor) found.  Neurological: She is alert and oriented to person, place, and  time.  Skin: Skin is warm and dry.  Psychiatric: She has a normal mood and affect. Her behavior is normal.   Results for orders placed during the hospital encounter of 09/28/13 (from the past 24 hour(s))  URINALYSIS, ROUTINE W REFLEX MICROSCOPIC     Status: Abnormal   Collection Time    09/28/13 11:02 AM      Result Value Range   Color, Urine YELLOW  YELLOW   APPearance CLEAR  CLEAR   Specific Gravity, Urine >1.030 (*) 1.005 - 1.030   pH 5.5  5.0 - 8.0   Glucose, UA NEGATIVE  NEGATIVE mg/dL   Hgb urine dipstick NEGATIVE  NEGATIVE   Bilirubin  Urine NEGATIVE  NEGATIVE   Ketones, ur NEGATIVE  NEGATIVE mg/dL   Protein, ur NEGATIVE  NEGATIVE mg/dL   Urobilinogen, UA 0.2  0.0 - 1.0 mg/dL   Nitrite NEGATIVE  NEGATIVE   Leukocytes, UA NEGATIVE  NEGATIVE  POCT PREGNANCY, URINE     Status: None   Collection Time    09/28/13 11:14 AM      Result Value Range   Preg Test, Ur NEGATIVE  NEGATIVE  CBC WITH DIFFERENTIAL     Status: Abnormal   Collection Time    09/28/13  1:13 PM      Result Value Range   WBC 9.7  4.0 - 10.5 K/uL   RBC 3.65 (*) 3.87 - 5.11 MIL/uL   Hemoglobin 11.9 (*) 12.0 - 15.0 g/dL   HCT 96.0 (*) 45.4 - 09.8 %   MCV 94.2  78.0 - 100.0 fL   MCH 32.6  26.0 - 34.0 pg   MCHC 34.6  30.0 - 36.0 g/dL   RDW 11.9  14.7 - 82.9 %   Platelets 210  150 - 400 K/uL   Neutrophils Relative % 67  43 - 77 %   Neutro Abs 6.5  1.7 - 7.7 K/uL   Lymphocytes Relative 23  12 - 46 %   Lymphs Abs 2.2  0.7 - 4.0 K/uL   Monocytes Relative 8  3 - 12 %   Monocytes Absolute 0.8  0.1 - 1.0 K/uL   Eosinophils Relative 2  0 - 5 %   Eosinophils Absolute 0.2  0.0 - 0.7 K/uL   Basophils Relative 0  0 - 1 %   Basophils Absolute 0.0  0.0 - 0.1 K/uL  WET PREP, GENITAL     Status: Abnormal   Collection Time    09/28/13  1:22 PM      Result Value Range   Yeast Wet Prep HPF POC NONE SEEN  NONE SEEN   Trich, Wet Prep NONE SEEN  NONE SEEN   Clue Cells Wet Prep HPF POC MODERATE (*) NONE SEEN   WBC, Wet Prep HPF POC FEW (*) NONE SEEN    US Transvaginal Non-ob  09/28/2013   CLINICAL DATA:  Right lower quadrant abdominal pain.  EXAM: TRANSVAGINAL ULTRASOUND OF PELVIS  TECHNIQUE: Transvaginal ultrasound examination of the pelvis was performed including evaluation of the uterus, ovaries, adnexal regions, and pelvic cul-de-sac.  COMPARISON:  Pelvic ultrasound 02/23/2013 and 08/19/2012 for the same complaints  FINDINGS: Uterus  Measurements: 9.0 x 3.8 x 4.9 cm. No fibroids or other mass visualized.  Endometrium  Thickness: 8.4 mm, within normal limits. No  focal abnormality visualized.  Right ovary  Measurements: 4.3 x 3.4 x 3.2 cm. A complex cyst measures 3.1 x 2.2 x 1.4 cm. A more simple cyst measures 1.4 x 0.9  x 1.0 cm.  Left ovary  Measurements: 2.3 x 1.6 x 1.6 cm, within normal limits. Normal appearance/no adnexal mass.  Other findings:  Moderate  IMPRESSION: 1. Complex cyst within the right ovary measures 3.1 x 2.2 x 1.4 cm. 2. Moderate free fluid. 3. These could be related to the patient's right lower quadrant pain or at least the acute component of it. 4. Simple cyst of the right ovary. 5. Normal appearance of the uterus and left ovary. 6. Recommend follow-up pelvic ultrasound at 6 or 10 weeks to assure resolution of the right adnexal findings.   Electronically Signed   By: Gennette Pac M.D.   On: 09/28/2013 14:44   US Pelvis Complete  09/28/2013   CLINICAL DATA:  Right lower quadrant abdominal pain.  EXAM: TRANSVAGINAL ULTRASOUND OF PELVIS  TECHNIQUE: Transvaginal ultrasound examination of the pelvis was performed including evaluation of the uterus, ovaries, adnexal regions, and pelvic cul-de-sac.  COMPARISON:  Pelvic ultrasound 02/23/2013 and 08/19/2012 for the same complaints  FINDINGS: Uterus  Measurements: 9.0 x 3.8 x 4.9 cm. No fibroids or other mass visualized.  Endometrium  Thickness: 8.4 mm, within normal limits. No focal abnormality visualized.  Right ovary  Measurements: 4.3 x 3.4 x 3.2 cm. A complex cyst measures 3.1 x 2.2 x 1.4 cm. A more simple cyst measures 1.4 x 0.9 x 1.0 cm.  Left ovary  Measurements: 2.3 x 1.6 x 1.6 cm, within normal limits. Normal appearance/no adnexal mass.  Other findings:  Moderate  IMPRESSION: 1. Complex cyst within the right ovary measures 3.1 x 2.2 x 1.4 cm. 2. Moderate free fluid. 3. These could be related to the patient's right lower quadrant pain or at least the acute component of it. 4. Simple cyst of the right ovary. 5. Normal appearance of the uterus and left ovary. 6. Recommend follow-up pelvic ultrasound  at 6 or 10 weeks to assure resolution of the right adnexal findings.   Electronically Signed   By: Gennette Pac M.D.   On: 09/28/2013 14:44   MAU Course  Procedures  GC/CHL culture to lab  MDM Discussed with patient. She was encouraged to follow up with Dr. Gaynell Face for U/S in 8 weeks  Assessment and Plan  A:  Abdomino/pelvic pain      Complex right ovarian cyst      Bacterial vaginosis    P: Instructed patient to follow up with Dr.Marshall      Rx for Flagyl to pharmacy      Pelvic rest X 1 week while on medication  Mollyann Halbert,EVE M 09/28/2013, 3:41 PM

## 2013-09-28 NOTE — Discharge Instructions (Signed)
Bacterial Vaginosis °Bacterial vaginosis is an infection of the vagina. It happens when too many of certain germs (bacteria) grow in the vagina. °HOME CARE °· Take your medicine as told by your doctor. °· Finish your medicine even if you start to feel better. °· Do not have sex until you finish your medicine and are better. °· Tell your sex partner that you have an infection. They should see their doctor for treatment. °· Practice safe sex. Use condoms. Have only one sex partner. °GET HELP IF: °· You are not getting better after 3 days of treatment. °· You have more grey fluid (discharge) coming from your vagina than before. °· You have more pain than before. °· You have a fever. °MAKE SURE YOU:  °· Understand these instructions. °· Will watch your condition. °· Will get help right away if you are not doing well or get worse. °Document Released: 05/25/2008 Document Revised: 06/06/2013 Document Reviewed: 03/28/2013 °[employer]® Patient Information ©2014 [employer]. ° °Ovarian Cyst °An ovarian cyst is a sac filled with fluid or blood. This sac is attached to the ovary. Some cysts go away on their own. Other cysts need treatment.  °HOME CARE  °· Only take medicine as told by your doctor. °· Follow up with your doctor as told. °· Get regular pelvic exams and Pap tests. °GET HELP IF: °· Your periods are late, not regular, or painful. °· You stop having periods. °· Your belly (abdominal) or pelvic pain does not go away. °· Your belly becomes large or puffy (swollen). °· You have a hard time peeing (totally emptying your bladder). °· You have pressure on your bladder. °· You have pain during sex. °· You feel fullness, pressure, or discomfort in your belly. °· You lose weight for no reason. °· You feel sick most of the time. °· You have a hard time pooping (constipation). °· You do not feel like eating. °· You develop pimples (acne). °· You have an increase in hair on your body and face. °· You are gaining weight for no  reason. °· You think you are pregnant. °GET HELP RIGHT AWAY IF:  °· Your belly pain gets worse. °· You feel sick to your stomach (nauseous), and you throw up (vomit). °· You have a fever that comes on fast. °· You have belly pain while pooping (bowel movement). °· Your periods are heavier than usual. °MAKE SURE YOU:  °· Understand these instructions. °· Will watch your condition. °· Will get help right away if you are not doing well or get worse. °Document Released: 02/02/2008 Document Revised: 06/06/2013 Document Reviewed: 04/23/2013 °[employer]® Patient Information ©2014 [employer]. ° °

## 2013-09-29 LAB — GC/CHLAMYDIA PROBE AMP
CT Probe RNA: NEGATIVE
GC Probe RNA: NEGATIVE

## 2013-11-08 ENCOUNTER — Other Ambulatory Visit (HOSPITAL_COMMUNITY): Payer: Self-pay | Admitting: Obstetrics

## 2013-11-08 DIAGNOSIS — N83299 Other ovarian cyst, unspecified side: Secondary | ICD-10-CM

## 2013-11-20 ENCOUNTER — Ambulatory Visit (HOSPITAL_COMMUNITY)
Admission: RE | Admit: 2013-11-20 | Discharge: 2013-11-20 | Disposition: A | Discharge status: Home / Self Care | Payer: Medicaid Other | Source: Ambulatory Visit | Attending: Obstetrics | Admitting: Obstetrics

## 2013-11-20 DIAGNOSIS — N83209 Unspecified ovarian cyst, unspecified side: Secondary | ICD-10-CM | POA: Insufficient documentation

## 2013-11-20 DIAGNOSIS — N83299 Other ovarian cyst, unspecified side: Secondary | ICD-10-CM

## 2013-12-19 ENCOUNTER — Encounter (HOSPITAL_COMMUNITY): Payer: Self-pay | Admitting: Emergency Medicine

## 2013-12-19 ENCOUNTER — Emergency Department (HOSPITAL_COMMUNITY)
Admission: EM | Admit: 2013-12-19 | Discharge: 2013-12-19 | Disposition: A | Discharge status: Home / Self Care | Payer: 59 | Source: Home / Self Care | Attending: Emergency Medicine | Admitting: Emergency Medicine

## 2013-12-19 DIAGNOSIS — H6120 Impacted cerumen, unspecified ear: Secondary | ICD-10-CM

## 2013-12-19 DIAGNOSIS — H60399 Other infective otitis externa, unspecified ear: Secondary | ICD-10-CM

## 2013-12-19 DIAGNOSIS — H6090 Unspecified otitis externa, unspecified ear: Secondary | ICD-10-CM

## 2013-12-19 MED ORDER — NEOMYCIN-POLYMYXIN-HC 3.5-10000-1 OT SUSP: 3.0000 [drp] | Freq: Four times a day (QID) | OTIC | Status: DC

## 2013-12-19 NOTE — ED Notes (Signed)
C/o pain, hearing loss, and ringing in left ear for ten days.   Pt states that she has flushed ears with peroxide with no relief but it seem to have made it worse.  Denies fever and any other symptoms.

## 2013-12-19 NOTE — Discharge Instructions (Signed)
Cerumen Plug A cerumen plug is having too much wax in your ear canal. The outer ear canal is lined with hairs and glands that secrete wax. This wax is called cerumen. This protects the ear canal. It also helps prevent material from entering the ear. Too much wax can cause a feeling of fullness in the ears, decreased hearing, ringing in the ears, or an earache. Sometimes your caregiver will remove a cerumen plug with an instrument called a curette. Or he/she may flush the ear canal with warm water from a syringe to remove the wax. You may simply be sent home to follow the home care instructions below for wax removal. Generally ear wax does not have to be removed unless it is causing a problem such as one of those listed above. When too much wax is causing a problem, the following are a few home remedies which can be used to help this problem. HOME CARE INSTRUCTIONS   Put a couple drops of glycerin, baby oil, or mineral oil in the ear a couple times of day. Do this every day for several days. After putting the drops in, you will need to lay with the affected ear pointing up for a couple minutes. This allows the drops to remain in the canal and run down to the area of wax blockage. This will soften the wax plug. It may also make your hearing worse as the wax softens and blocks the canal even more.  After a couple days, you may gently flush the ear canal with warm water from a syringe. Do this by pulling your ear up and back with your head tilted slightly forward and towards a pan to catch the water. This is most easily done with a helper. You can also accomplish the same thing by letting the shower beat into your ear canal to wash the wax out. Sometimes this will not be immediately successful. You will have to return to the first step of using the oil to further soften the wax. Then resume washing the ear canal out with a syringe or shower.  Following removal of the wax, put ten to twenty drops of rubbing  alcohol into the outer ears. This will dry the canal and prevent an infection.  Do not irrigate or wash out your ears if you have had a perforated ear drum or mastoid surgery. SEEK IMMEDIATE MEDICAL CARE IF:   You are unsuccessful with the above instructions for home care.  You develop ear pain or drainage from the ear. MAKE SURE YOU:   Understand these instructions.  Will watch your condition.  Will get help right away if you are not doing well or get worse. Document Released: 05/11/2001 Document Revised: 11/08/2011 Document Reviewed: 08/07/2008 Exit[employer] Patient Information 2014 [employer].  

## 2013-12-19 NOTE — ED Provider Notes (Signed)
  Chief Complaint   Chief Complaint  Patient presents with  . Otalgia    History of Present Illness   Debra Boyd is a 23 year old female who has had a ten-day history of left ear pain and congestion. She has cerumen impactions before. She denies any drainage from the ears. She's had no fever, headache, nasal congestion, rhinorrhea, sore throat, or adenopathy.  Review of Systems   Other than as noted above, the patient denies any of the following symptoms: Systemic:  No fevers or chills. Eye:  No redness, pain, discharge, itching, blurred vision, or diplopia. ENT:  No headache, nasal congestion, sneezing, itching, epistaxis, ear pain, decreased hearing, ringing in ears, vertigo, or tinnitus.  No oral lesions, sore throat, or hoarseness. Neck:  No neck pain or adenopathy. Skin:  No rash or itching.  PMFSH   Past medical history, family history, social history, meds, and allergies were reviewed.   Physical Examination     Vital signs:  BP 114/75  Pulse 53  Temp(Src) 98.4 F (36.9 C) (Oral)  Resp 17  SpO2 98%  LMP 12/19/2013 General:  Alert and oriented.  In no distress.  Skin warm and dry. Eye:  PERRL, full EOMs, lids and conjunctiva normal.   ENT:  She has cerumen impactions in both ear canals. These were irrigated clear. Thereafter the canals were somewhat reddened, TMs are normal.  Nasal mucosa not congested and without drainage.  Mucous membranes moist, no oral lesions, normal dentition, pharynx clear.  No cranial or facial pain to palplation. Neck:  Supple, full ROM.  No adenopathy, tenderness or mass.  Thyroid normal. Lungs:  Breath sounds clear and equal bilaterally.  No wheezes, rales or rhonchi. Heart:  Rhythm regular, without extrasystoles.  No gallops or murmers. Skin:  Clear, warm and dry.   Course in Urgent Care Center   The ears were irrigated clear, TMs were thereafter normal, and the canals were slightly erythematous.  Assessment   The primary  encounter diagnosis was Cerumen impaction. A diagnosis of Otitis externa was also pertinent to this visit.  Plan    1.  Meds:  The following meds were prescribed:   Discharge Medication List as of 12/19/2013  7:13 PM    START taking these medications   Details  neomycin-polymyxin-hydrocortisone (CORTISPORIN) 3.5-10000-1 otic suspension Place 3 drops into both ears 4 (four) times daily., Starting 12/19/2013, Until Discontinued, Normal        2.  Patient Education/Counseling:  The patient was given appropriate handouts, self care instructions, and instructed in symptomatic relief.    3.  Follow up:  The patient was told to follow up here if no better in 3 to 4 days, or sooner if becoming worse in any way, and given some red flag symptoms such as persistent or worsening pain which would prompt immediate return.       Reuben Likesavid C Finnean Cerami, MD 12/19/13 2106

## 2014-01-14 ENCOUNTER — Emergency Department (HOSPITAL_COMMUNITY): Payer: 59

## 2014-01-14 ENCOUNTER — Emergency Department (HOSPITAL_COMMUNITY)
Admission: EM | Admit: 2014-01-14 | Discharge: 2014-01-14 | Discharge status: Against Medical Advice | Payer: 59 | Attending: Emergency Medicine | Admitting: Emergency Medicine

## 2014-01-14 ENCOUNTER — Encounter (HOSPITAL_COMMUNITY): Payer: Self-pay | Admitting: Emergency Medicine

## 2014-01-14 DIAGNOSIS — R079 Chest pain, unspecified: Secondary | ICD-10-CM | POA: Insufficient documentation

## 2014-01-14 DIAGNOSIS — F17200 Nicotine dependence, unspecified, uncomplicated: Secondary | ICD-10-CM | POA: Diagnosis not present

## 2014-01-14 LAB — BASIC METABOLIC PANEL
BUN: 14 mg/dL (ref 6–23)
CO2: 26 mEq/L (ref 19–32)
CREATININE: 0.65 mg/dL (ref 0.50–1.10)
Calcium: 9 mg/dL (ref 8.4–10.5)
Chloride: 105 mEq/L (ref 96–112)
GFR calc Af Amer: >90 mL/min (ref >90)
Glucose, Bld: 82 mg/dL (ref 70–99)
Potassium: 3.9 mEq/L (ref 3.7–5.3)
Sodium: 141 mEq/L (ref 137–147)

## 2014-01-14 LAB — CBC
HCT: 31.2 % — ABNORMAL LOW (ref 36.0–46.0)
Hemoglobin: 10.7 g/dL — ABNORMAL LOW (ref 12.0–15.0)
MCH: 33 pg (ref 26.0–34.0)
MCHC: 34.3 g/dL (ref 30.0–36.0)
MCV: 96.3 fL (ref 78.0–100.0)
Platelets: 240 10*3/uL (ref 150–400)
RBC: 3.24 MIL/uL — ABNORMAL LOW (ref 3.87–5.11)
RDW: 11.5 % (ref 11.5–15.5)
WBC: 10 10*3/uL (ref 4.0–10.5)

## 2014-01-14 LAB — I-STAT TROPONIN, ED: Troponin i, poc: 0 ng/mL (ref 0.00–0.08)

## 2014-01-14 LAB — PRO B NATRIURETIC PEPTIDE: Pro B Natriuretic peptide (BNP): 65.4 pg/mL (ref 0–125)

## 2014-01-14 NOTE — ED Notes (Signed)
Pt reports non-radiating 5/10  left sided "dull" chest pain since this afternoon. Associated with SOB, lightheadedness. Denies N/V/diaphoresis. Denies recent cough. AO x4. NAD. VSS.

## 2014-07-01 ENCOUNTER — Encounter (HOSPITAL_COMMUNITY): Payer: Self-pay | Admitting: Emergency Medicine

## 2015-03-23 ENCOUNTER — Inpatient Hospital Stay (HOSPITAL_COMMUNITY): Payer: 59

## 2015-03-23 ENCOUNTER — Encounter (HOSPITAL_COMMUNITY): Payer: Self-pay | Admitting: *Deleted

## 2015-03-23 ENCOUNTER — Inpatient Hospital Stay (HOSPITAL_COMMUNITY)
Admission: AD | Admit: 2015-03-23 | Discharge: 2015-03-23 | Disposition: A | Discharge status: Home / Self Care | Payer: 59 | Source: Ambulatory Visit | Attending: Obstetrics and Gynecology | Admitting: Obstetrics and Gynecology

## 2015-03-23 DIAGNOSIS — N8320 Unspecified ovarian cysts: Secondary | ICD-10-CM | POA: Insufficient documentation

## 2015-03-23 DIAGNOSIS — N83201 Unspecified ovarian cyst, right side: Secondary | ICD-10-CM

## 2015-03-23 DIAGNOSIS — Z87891 Personal history of nicotine dependence: Secondary | ICD-10-CM | POA: Diagnosis not present

## 2015-03-23 DIAGNOSIS — R1031 Right lower quadrant pain: Secondary | ICD-10-CM | POA: Diagnosis present

## 2015-03-23 HISTORY — DX: Herpesviral infection of urogenital system, unspecified: A60.00

## 2015-03-23 LAB — CBC
HCT: 34.2 % — ABNORMAL LOW (ref 36.0–46.0)
Hemoglobin: 11.3 g/dL — ABNORMAL LOW (ref 12.0–15.0)
MCH: 32.6 pg (ref 26.0–34.0)
MCHC: 33 g/dL (ref 30.0–36.0)
MCV: 98.6 fL (ref 78.0–100.0)
Platelets: 291 10*3/uL (ref 150–400)
RBC: 3.47 MIL/uL — AB (ref 3.87–5.11)
RDW: 11.8 % (ref 11.5–15.5)
WBC: 14.3 10*3/uL — ABNORMAL HIGH (ref 4.0–10.5)

## 2015-03-23 LAB — WET PREP, GENITAL
Clue Cells Wet Prep HPF POC: NONE SEEN
Trich, Wet Prep: NONE SEEN
Yeast Wet Prep HPF POC: NONE SEEN

## 2015-03-23 LAB — URINALYSIS, ROUTINE W REFLEX MICROSCOPIC
Bilirubin Urine: NEGATIVE
Glucose, UA: NEGATIVE mg/dL
Hgb urine dipstick: NEGATIVE
Ketones, ur: NEGATIVE mg/dL
Leukocytes, UA: NEGATIVE
Nitrite: NEGATIVE
PH: 6 (ref 5.0–8.0)
Protein, ur: NEGATIVE mg/dL
SPECIFIC GRAVITY, URINE: 1.02 (ref 1.005–1.030)
Urobilinogen, UA: 1 mg/dL (ref 0.0–1.0)

## 2015-03-23 LAB — POCT PREGNANCY, URINE: Preg Test, Ur: NEGATIVE

## 2015-03-23 NOTE — MAU Note (Signed)
Pt states all day today she has had a dull pain in her right pelvic area. States this evening it is worsening and feels like a stabbing pain. Denies dysuria.

## 2015-03-23 NOTE — MAU Provider Note (Signed)
History     CSN: [id number]  Arrival date and time: 03/23/15 1911   First Provider Initiated Contact with Patient 03/23/15 2010      No chief complaint on file. CC: RLQ pain HPI Debra Boyd 24 y.o. G1P1001 nonpregnant female presents to MAU for right sided pelvic pain.  It started this morning as a dull constant pain all day.  At 5:30pm, it became a severe sharp stabbing pain.  She has taken 2 midol and that seemed to be helpful but has not made it completely better.   This happened once previously and she was told she had BV and ovarian cysts at that time.  Another time, she was told it was PID and BV.  She has normal vaginal discharge.  She is not on any hormonal medications.  No sex since December.  Denies Nausea, vomiting, headache, weakness, sob, chest pain.  Pain is 2-3/10 OB History    Gravida Para Term Preterm AB TAB SAB Ectopic Multiple Living   1 1 1       1       Past Medical History  Diagnosis Date  . Depression   . Pelvic inflammatory disease   . Gonorrhea   . Chlamydia   . Trichomonas   . Genital HSV     Past Surgical History  Procedure Laterality Date  . Cesarean section      Family History  Problem Relation Age of Onset  . Diabetes Maternal Aunt   . Kidney disease Paternal Aunt   . Heart disease Maternal Grandmother   . Hypertension Maternal Grandmother   . Hyperlipidemia Maternal Grandmother   . Lupus Maternal Aunt     History  Substance Use Topics  . Smoking status: Former Smoker -- 0.50 packs/day    Types: Cigarettes  . Smokeless tobacco: Never Used  . Alcohol Use: No    Allergies: No Known Allergies  Prescriptions prior to admission  Medication Sig Dispense Refill Last Dose  . Ibuprofen (MIDOL) 200 MG CAPS Take 400 mg by mouth daily as needed (pain).   03/23/2015 at Unknown time  . Multiple Vitamin (MULTIVITAMIN WITH MINERALS) TABS tablet Take 1 tablet by mouth daily.   03/22/2015 at Unknown time  . valACYclovir (VALTREX) 1000 MG tablet  Take 1,000 mg by mouth daily.   03/23/2015 at Unknown time    ROS Pertinent ROS in HPI.  All other systems are negative.   Physical Exam   Blood pressure 117/66, pulse 61, temperature 98.6 F (37 C), temperature source Oral, resp. rate 16, height 5\' 2"  (1.575 m), weight 158 lb (71.668 kg), last menstrual period 03/10/2015, SpO2 100 %.  Physical Exam  Constitutional: She is oriented to person, place, and time. She appears well-developed and well-nourished. No distress.  HENT:  Head: Normocephalic and atraumatic.  Eyes: EOM are normal.  Neck: Normal range of motion.  Cardiovascular: Normal rate.   Respiratory: Effort normal. No respiratory distress.  GI: Soft. She exhibits no distension.  Genitourinary:  Scant white discharge No CMT Mild Right adnexal pain but no mass appreciated.    Musculoskeletal: Normal range of motion.  Neurological: She is alert and oriented to person, place, and time.  Skin: Skin is warm and dry.  Psychiatric: She has a normal mood and affect.   Results for orders placed or performed during the hospital encounter of 03/23/15 (from the past 24 hour(s))  Urinalysis, Routine w reflex microscopic (not at [facility])     Status: None   Collection  Time: 03/23/15  7:26 PM  Result Value Ref Range   Color, Urine YELLOW YELLOW   APPearance CLEAR CLEAR   Specific Gravity, Urine 1.020 1.005 - 1.030   pH 6.0 5.0 - 8.0   Glucose, UA NEGATIVE NEGATIVE mg/dL   Hgb urine dipstick NEGATIVE NEGATIVE   Bilirubin Urine NEGATIVE NEGATIVE   Ketones, ur NEGATIVE NEGATIVE mg/dL   Protein, ur NEGATIVE NEGATIVE mg/dL   Urobilinogen, UA 1.0 0.0 - 1.0 mg/dL   Nitrite NEGATIVE NEGATIVE   Leukocytes, UA NEGATIVE NEGATIVE  Pregnancy, urine POC     Status: None   Collection Time: 03/23/15  7:33 PM  Result Value Ref Range   Preg Test, Ur NEGATIVE NEGATIVE  Wet prep, genital     Status: Abnormal   Collection Time: 03/23/15  8:30 PM  Result Value Ref Range   Yeast Wet Prep HPF POC NONE  SEEN NONE SEEN   Trich, Wet Prep NONE SEEN NONE SEEN   Clue Cells Wet Prep HPF POC NONE SEEN NONE SEEN   WBC, Wet Prep HPF POC RARE (A) NONE SEEN  CBC     Status: Abnormal   Collection Time: 03/23/15  9:50 PM  Result Value Ref Range   WBC 14.3 (H) 4.0 - 10.5 K/uL   RBC 3.47 (L) 3.87 - 5.11 MIL/uL   Hemoglobin 11.3 (L) 12.0 - 15.0 g/dL   HCT 16.1 (L) 09.6 - 04.5 %   MCV 98.6 78.0 - 100.0 fL   MCH 32.6 26.0 - 34.0 pg   MCHC 33.0 30.0 - 36.0 g/dL   RDW 40.9 81.1 - 91.4 %   Platelets 291 150 - 400 K/uL   US Transvaginal Non-ob  03/23/2015   CLINICAL DATA:  Right lower quadrant pain. Intermittent pain every other month for 1 year. Rule out ovarian torsion.  EXAM: TRANSABDOMINAL AND TRANSVAGINAL ULTRASOUND OF PELVIS  DOPPLER ULTRASOUND OF OVARIES  TECHNIQUE: Both transabdominal and transvaginal ultrasound examinations of the pelvis were performed. Transabdominal technique was performed for global imaging of the pelvis including uterus, ovaries, adnexal regions, and pelvic cul-de-sac.  It was necessary to proceed with endovaginal exam following the transabdominal exam to visualize the right and left ovary. Color and duplex Doppler ultrasound was utilized to evaluate blood flow to the ovaries.  COMPARISON:  Pelvic ultrasound 09/28/2013  FINDINGS: Uterus  Measurements: 9.6 x 4.3 x 4.8 cm. No fibroids or other mass visualized. Caesarean section scar visualized. Small amount of fluid in the cervical canal.  Endometrium  Thickness: 12.9 mm.  No focal abnormality visualized.  Right ovary  Measurements: 3.1 x 2.3 x 1.9 cm. There is a probable collapsed cyst measuring 1.5 x 0.6 x 1.1 cm. No adnexal mass. Normal blood flow seen to the ovarian parenchyma.  Left ovary  Measurements: 2.9 x 1.2 x 2.0 cm. Normal appearance/no adnexal mass. Normal blood flow seen to the ovarian parenchyma.  Pulsed Doppler evaluation of both ovaries demonstrates normal low-resistance arterial and venous waveforms.  Other findings  Small  volume of simple free fluid in the pelvis.  IMPRESSION: 1. No evidence of ovarian torsion. 2. Probable collapsed cyst in the right ovary measuring 1.5 cm. No further follow-up is needed. 3. Normal appearance of the uterus and left ovary.   Electronically Signed   By: Rubye Oaks M.D.   On: 03/23/2015 21:35   US Pelvis Complete  03/23/2015   CLINICAL DATA:  Right lower quadrant pain. Intermittent pain every other month for 1 year. Rule out ovarian torsion.  EXAM: TRANSABDOMINAL AND TRANSVAGINAL ULTRASOUND OF PELVIS  DOPPLER ULTRASOUND OF OVARIES  TECHNIQUE: Both transabdominal and transvaginal ultrasound examinations of the pelvis were performed. Transabdominal technique was performed for global imaging of the pelvis including uterus, ovaries, adnexal regions, and pelvic cul-de-sac.  It was necessary to proceed with endovaginal exam following the transabdominal exam to visualize the right and left ovary. Color and duplex Doppler ultrasound was utilized to evaluate blood flow to the ovaries.  COMPARISON:  Pelvic ultrasound 09/28/2013  FINDINGS: Uterus  Measurements: 9.6 x 4.3 x 4.8 cm. No fibroids or other mass visualized. Caesarean section scar visualized. Small amount of fluid in the cervical canal.  Endometrium  Thickness: 12.9 mm.  No focal abnormality visualized.  Right ovary  Measurements: 3.1 x 2.3 x 1.9 cm. There is a probable collapsed cyst measuring 1.5 x 0.6 x 1.1 cm. No adnexal mass. Normal blood flow seen to the ovarian parenchyma.  Left ovary  Measurements: 2.9 x 1.2 x 2.0 cm. Normal appearance/no adnexal mass. Normal blood flow seen to the ovarian parenchyma.  Pulsed Doppler evaluation of both ovaries demonstrates normal low-resistance arterial and venous waveforms.  Other findings  Small volume of simple free fluid in the pelvis.  IMPRESSION: 1. No evidence of ovarian torsion. 2. Probable collapsed cyst in the right ovary measuring 1.5 cm. No further follow-up is needed. 3. Normal appearance of  the uterus and left ovary.   Electronically Signed   By: Rubye Oaks M.D.   On: 03/23/2015 21:35   [place] Art/ven Flow Abd Pelv Doppler  03/23/2015   CLINICAL DATA:  Right lower quadrant pain. Intermittent pain every other month for 1 year. Rule out ovarian torsion.  EXAM: TRANSABDOMINAL AND TRANSVAGINAL ULTRASOUND OF PELVIS  DOPPLER ULTRASOUND OF OVARIES  TECHNIQUE: Both transabdominal and transvaginal ultrasound examinations of the pelvis were performed. Transabdominal technique was performed for global imaging of the pelvis including uterus, ovaries, adnexal regions, and pelvic cul-de-sac.  It was necessary to proceed with endovaginal exam following the transabdominal exam to visualize the right and left ovary. Color and duplex Doppler ultrasound was utilized to evaluate blood flow to the ovaries.  COMPARISON:  Pelvic ultrasound 09/28/2013  FINDINGS: Uterus  Measurements: 9.6 x 4.3 x 4.8 cm. No fibroids or other mass visualized. Caesarean section scar visualized. Small amount of fluid in the cervical canal.  Endometrium  Thickness: 12.9 mm.  No focal abnormality visualized.  Right ovary  Measurements: 3.1 x 2.3 x 1.9 cm. There is a probable collapsed cyst measuring 1.5 x 0.6 x 1.1 cm. No adnexal mass. Normal blood flow seen to the ovarian parenchyma.  Left ovary  Measurements: 2.9 x 1.2 x 2.0 cm. Normal appearance/no adnexal mass. Normal blood flow seen to the ovarian parenchyma.  Pulsed Doppler evaluation of both ovaries demonstrates normal low-resistance arterial and venous waveforms.  Other findings  Small volume of simple free fluid in the pelvis.  IMPRESSION: 1. No evidence of ovarian torsion. 2. Probable collapsed cyst in the right ovary measuring 1.5 cm. No further follow-up is needed. 3. Normal appearance of the uterus and left ovary.   Electronically Signed   By: Rubye Oaks M.D.   On: 03/23/2015 21:35   MAU Course  Procedures  MDM Likely collapsed cyst on U/S.  No torsion. No sign of gyn  infection.    Assessment and Plan  A:  1. Cyst of right ovary   2. Right lower quadrant pain    P: Discharge to home Motrin/ibuprofen prn F/u  with GYN  May benefit from OCP to address cysts Patient may return to MAU as needed or if her condition were to change or worsen   Bertram Denver 03/23/2015, 8:11 PM

## 2015-03-23 NOTE — Discharge Instructions (Signed)
Ovarian Cyst An ovarian cyst is a fluid-filled sac that forms on an ovary. The ovaries are small organs that produce eggs in women. Various types of cysts can form on the ovaries. Most are not cancerous. Many do not cause problems, and they often go away on their own. Some may cause symptoms and require treatment. Common types of ovarian cysts include:  Functional cysts--These cysts may occur every month during the menstrual cycle. This is normal. The cysts usually go away with the next menstrual cycle if the woman does not get pregnant. Usually, there are no symptoms with a functional cyst.  Endometrioma cysts--These cysts form from the tissue that lines the uterus. They are also called "chocolate cysts" because they become filled with blood that turns brown. This type of cyst can cause pain in the lower abdomen during intercourse and with your menstrual period.  Cystadenoma cysts--This type develops from the cells on the outside of the ovary. These cysts can get very big and cause lower abdomen pain and pain with intercourse. This type of cyst can twist on itself, cut off its blood supply, and cause severe pain. It can also easily rupture and cause a lot of pain.  Dermoid cysts--This type of cyst is sometimes found in both ovaries. These cysts may contain different kinds of body tissue, such as skin, teeth, hair, or cartilage. They usually do not cause symptoms unless they get very big.  Theca lutein cysts--These cysts occur when too much of a certain hormone (human chorionic gonadotropin) is produced and overstimulates the ovaries to produce an egg. This is most common after procedures used to assist with the conception of a baby (in vitro fertilization). CAUSES   Fertility drugs can cause a condition in which multiple large cysts are formed on the ovaries. This is called ovarian hyperstimulation syndrome.  A condition called polycystic ovary syndrome can cause hormonal imbalances that can lead to  nonfunctional ovarian cysts. SIGNS AND SYMPTOMS  Many ovarian cysts do not cause symptoms. If symptoms are present, they may include:  Pelvic pain or pressure.  Pain in the lower abdomen.  Pain during sexual intercourse.  Increasing girth (swelling) of the abdomen.  Abnormal menstrual periods.  Increasing pain with menstrual periods.  Stopping having menstrual periods without being pregnant. DIAGNOSIS  These cysts are commonly found during a routine or annual pelvic exam. Tests may be ordered to find out more about the cyst. These tests may include:  Ultrasound.  X-ray of the pelvis.  CT scan.  MRI.  Blood tests. TREATMENT  Many ovarian cysts go away on their own without treatment. Your health care provider may want to check your cyst regularly for 2-3 months to see if it changes. For women in menopause, it is particularly important to monitor a cyst closely because of the higher rate of ovarian cancer in menopausal women. When treatment is needed, it may include any of the following:  A procedure to drain the cyst (aspiration). This may be done using a long needle and ultrasound. It can also be done through a laparoscopic procedure. This involves using a thin, lighted tube with a tiny camera on the end (laparoscope) inserted through a small incision.  Surgery to remove the whole cyst. This may be done using laparoscopic surgery or an open surgery involving a larger incision in the lower abdomen.  Hormone treatment or birth control pills. These methods are sometimes used to help dissolve a cyst. HOME CARE INSTRUCTIONS   Only take over-the-counter   or prescription medicines as directed by your health care provider.  Follow up with your health care provider as directed.  Get regular pelvic exams and Pap tests. SEEK MEDICAL CARE IF:   Your periods are late, irregular, or painful, or they stop.  Your pelvic pain or abdominal pain does not go away.  Your abdomen becomes  larger or swollen.  You have pressure on your bladder or trouble emptying your bladder completely.  You have pain during sexual intercourse.  You have feelings of fullness, pressure, or discomfort in your stomach.  You lose weight for no apparent reason.  You feel generally ill.  You become constipated.  You lose your appetite.  You develop acne.  You have an increase in body and facial hair.  You are gaining weight, without changing your exercise and eating habits.  You think you are pregnant. SEEK IMMEDIATE MEDICAL CARE IF:   You have increasing abdominal pain.  You feel sick to your stomach (nauseous), and you throw up (vomit).  You develop a fever that comes on suddenly.  You have abdominal pain during a bowel movement.  Your menstrual periods become heavier than usual. MAKE SURE YOU:  Understand these instructions.  Will watch your condition.  Will get help right away if you are not doing well or get worse. Document Released: 08/16/2005 Document Revised: 08/21/2013 Document Reviewed: 04/23/2013 [employer] Patient Information 2015 [employer]. This information is not intended to replace advice given to you by your health care provider. Make sure you discuss any questions you have with your health care provider.  

## 2015-03-24 LAB — GC/CHLAMYDIA PROBE AMP (~~LOC~~) NOT AT ARMC
Chlamydia: NEGATIVE
Neisseria Gonorrhea: NEGATIVE

## 2015-03-24 LAB — HIV ANTIBODY (ROUTINE TESTING W REFLEX): HIV Screen 4th Generation wRfx: NONREACTIVE

## 2015-05-22 IMAGING — US US TRANSVAGINAL NON-OB
1 series · 13 of 25 positions shown · non-contrast
Comparison: Pelvic ultrasound - 08/19/2012

CLINICAL DATA: Right-sided adnexal tenderness, history of pelvic
pain, history of PID



[Series 1: us pelvis complete · 13 of 41 slices shown]
[im 1/41]
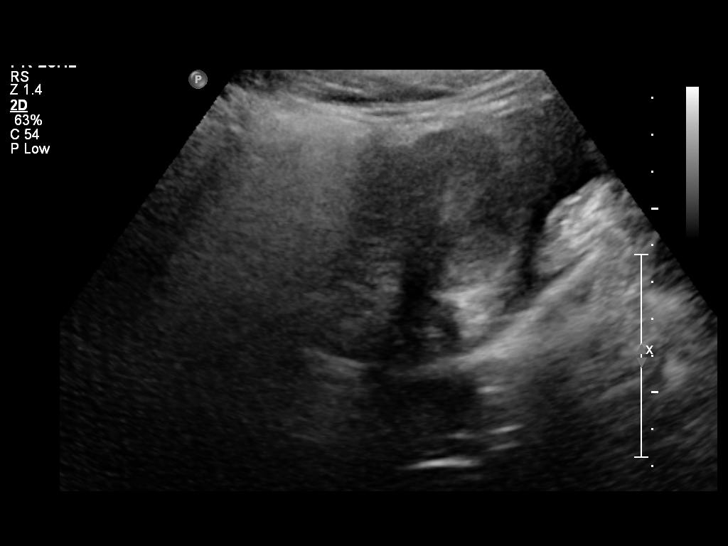
[im 4/41]
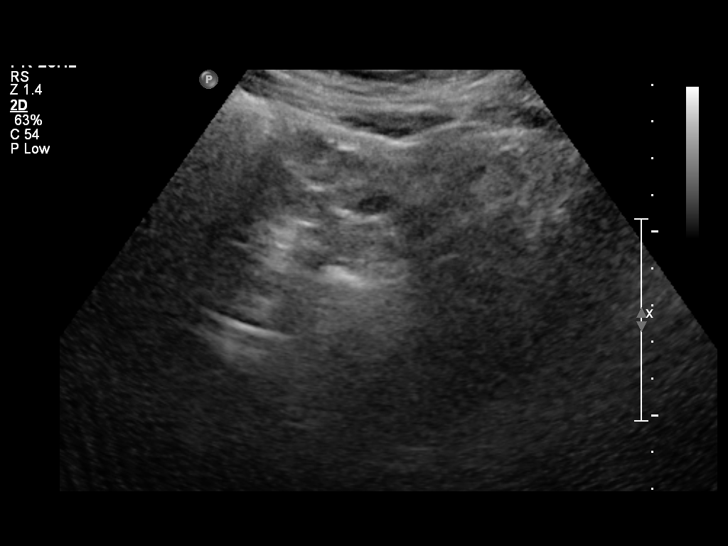
[im 7/41]
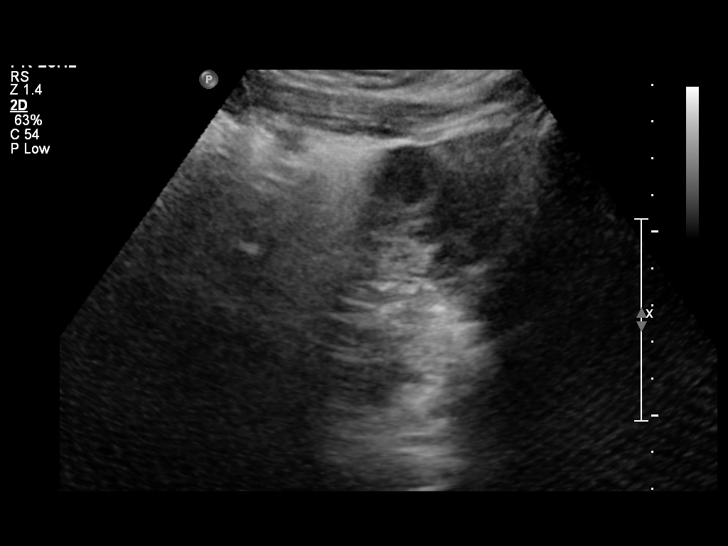
[im 11/41]
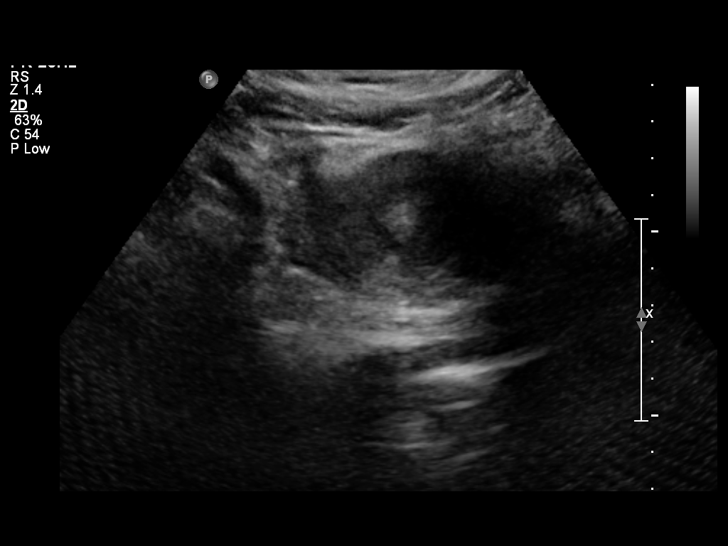
[im 14/41]
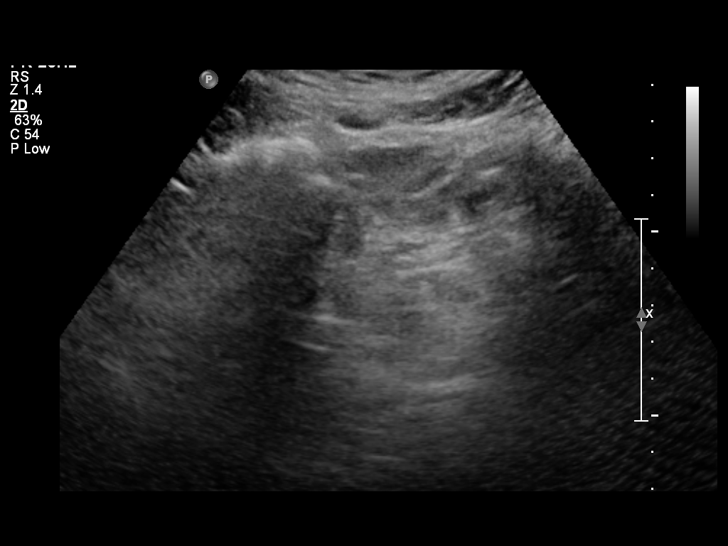
[im 17/41]
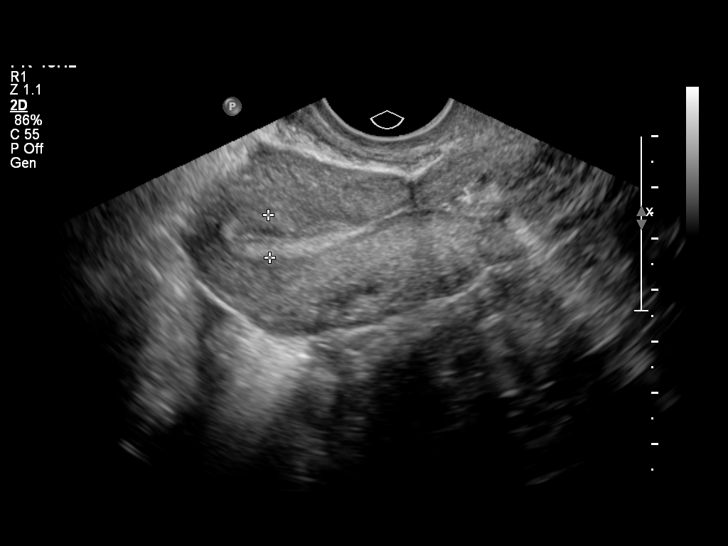
[im 21/41]
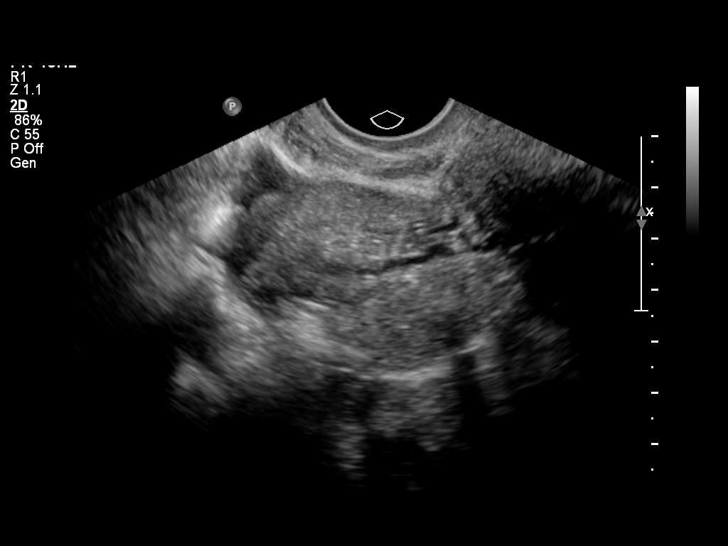
[im 24/41]
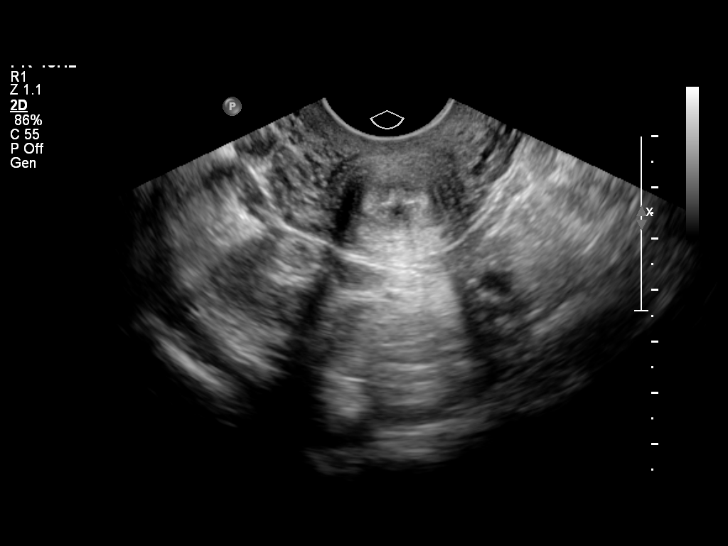
[im 27/41]
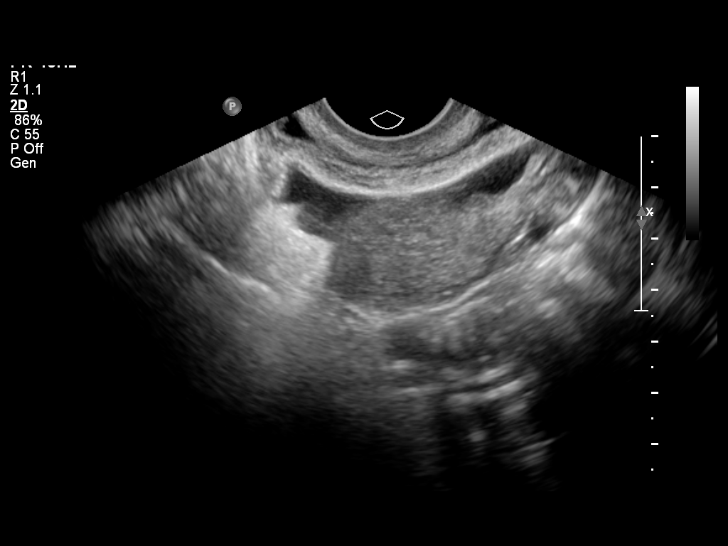
[im 31/41]
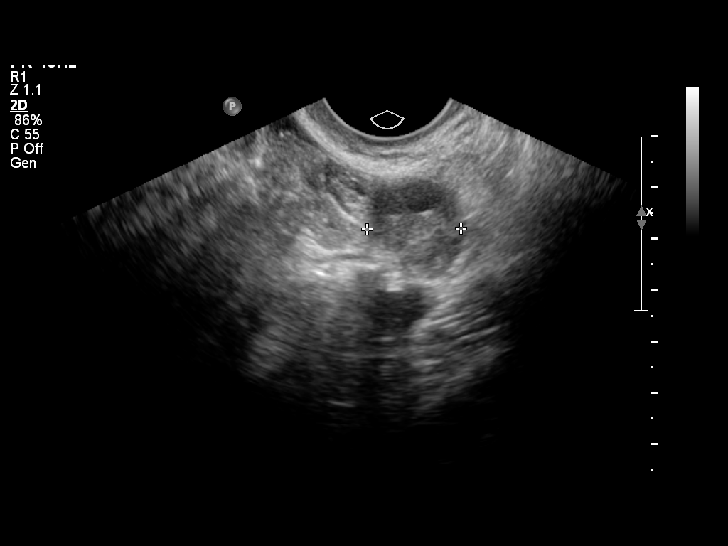
[im 34/41]
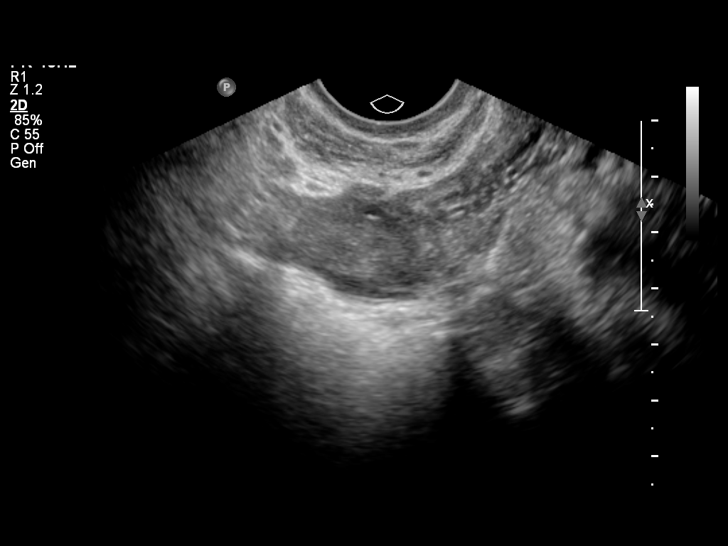
[im 37/41]
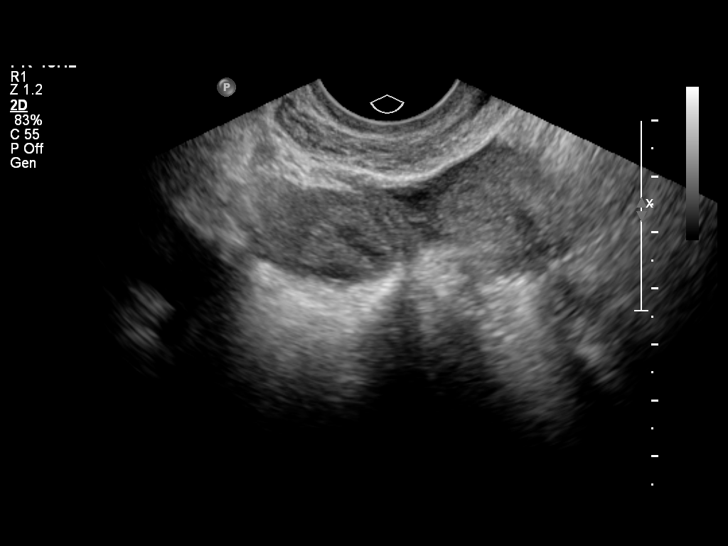
[im 41/41]
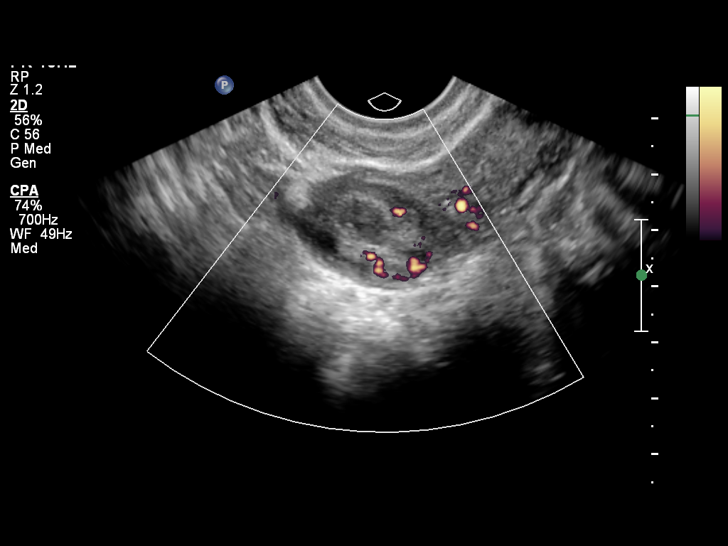

[13 of 25 positions shown; findings below may reference images not displayed]

FINDINGS: Uterus: Normal in size measuring 9.2 x 3.6 x 4.1 cm.  The uterus is
anteverted.  No discrete uterine mass.

Endometrium: Normal in thickness and appearance

Right ovary:  Normal in size measuring 2.8 x 2.0 x 2.1 cm.
Incidental note is made of a sub centimeter (approximately 0.9 cm)
hypoechoic cyst within the right ovary, favored to represent an
involuting physiologic cyst.  Otherwise, no discrete right-sided
ovarian/adnexal lesions.

Left ovary: Normal in size measuring 3.1 x 1.6 x 1.8 cm.  Several
peripheral follicles are incidentally noted within the left ovary.

Other findings: A trace amount of free fluid is noted within the
pelvic cul-de-sac.
IMPRESSION: No explanation for patient's right lower quadrant abdominal pain.
Specifically, no evidence of pelvic mass or other significant
abnormality.

## 2015-10-21 DIAGNOSIS — N898 Other specified noninflammatory disorders of vagina: Secondary | ICD-10-CM | POA: Diagnosis not present

## 2015-10-21 DIAGNOSIS — Z202 Contact with and (suspected) exposure to infections with a predominantly sexual mode of transmission: Secondary | ICD-10-CM | POA: Diagnosis not present

## 2015-12-07 ENCOUNTER — Inpatient Hospital Stay (HOSPITAL_COMMUNITY): Payer: 59

## 2015-12-07 ENCOUNTER — Encounter (HOSPITAL_COMMUNITY): Payer: Self-pay | Admitting: *Deleted

## 2015-12-07 ENCOUNTER — Inpatient Hospital Stay (HOSPITAL_COMMUNITY)
Admission: AD | Admit: 2015-12-07 | Discharge: 2015-12-07 | Disposition: A | Discharge status: Home / Self Care | Payer: 59 | Source: Ambulatory Visit | Attending: Obstetrics & Gynecology | Admitting: Obstetrics & Gynecology

## 2015-12-07 DIAGNOSIS — N83209 Unspecified ovarian cyst, unspecified side: Secondary | ICD-10-CM

## 2015-12-07 DIAGNOSIS — Z79899 Other long term (current) drug therapy: Secondary | ICD-10-CM | POA: Insufficient documentation

## 2015-12-07 DIAGNOSIS — N83202 Unspecified ovarian cyst, left side: Secondary | ICD-10-CM | POA: Diagnosis not present

## 2015-12-07 DIAGNOSIS — R1031 Right lower quadrant pain: Secondary | ICD-10-CM | POA: Diagnosis present

## 2015-12-07 DIAGNOSIS — K5900 Constipation, unspecified: Secondary | ICD-10-CM | POA: Insufficient documentation

## 2015-12-07 DIAGNOSIS — N83201 Unspecified ovarian cyst, right side: Secondary | ICD-10-CM | POA: Insufficient documentation

## 2015-12-07 DIAGNOSIS — Z87891 Personal history of nicotine dependence: Secondary | ICD-10-CM | POA: Diagnosis not present

## 2015-12-07 DIAGNOSIS — R102 Pelvic and perineal pain: Secondary | ICD-10-CM | POA: Diagnosis not present

## 2015-12-07 DIAGNOSIS — R1909 Other intra-abdominal and pelvic swelling, mass and lump: Secondary | ICD-10-CM | POA: Diagnosis not present

## 2015-12-07 LAB — POCT PREGNANCY, URINE: PREG TEST UR: NEGATIVE

## 2015-12-07 LAB — HCG, QUANTITATIVE, PREGNANCY: hCG, Beta Chain, Quant, S: <1 m[IU]/mL (ref <5)

## 2015-12-07 LAB — URINALYSIS, ROUTINE W REFLEX MICROSCOPIC
Bilirubin Urine: NEGATIVE
Glucose, UA: NEGATIVE mg/dL
Hgb urine dipstick: NEGATIVE
Ketones, ur: NEGATIVE mg/dL
LEUKOCYTES UA: NEGATIVE
NITRITE: NEGATIVE
Protein, ur: NEGATIVE mg/dL
SPECIFIC GRAVITY, URINE: 1.015 (ref 1.005–1.030)
pH: 6.5 (ref 5.0–8.0)

## 2015-12-07 LAB — CBC WITH DIFFERENTIAL/PLATELET
BASOS ABS: 0 10*3/uL (ref 0.0–0.1)
Basophils Relative: 0 %
Eosinophils Absolute: 0.1 10*3/uL (ref 0.0–0.7)
Eosinophils Relative: 1 %
HEMATOCRIT: 33.8 % — AB (ref 36.0–46.0)
Hemoglobin: 11.3 g/dL — ABNORMAL LOW (ref 12.0–15.0)
Lymphocytes Relative: 21 %
Lymphs Abs: 2.8 10*3/uL (ref 0.7–4.0)
MCH: 32.5 pg (ref 26.0–34.0)
MCHC: 33.4 g/dL (ref 30.0–36.0)
MCV: 97.1 fL (ref 78.0–100.0)
Monocytes Absolute: 0.5 10*3/uL (ref 0.1–1.0)
Monocytes Relative: 4 %
NEUTROS ABS: 10.1 10*3/uL — AB (ref 1.7–7.7)
Neutrophils Relative %: 74 %
PLATELETS: 283 10*3/uL (ref 150–400)
RBC: 3.48 MIL/uL — AB (ref 3.87–5.11)
RDW: 12 % (ref 11.5–15.5)
WBC: 13.5 10*3/uL — AB (ref 4.0–10.5)

## 2015-12-07 LAB — BASIC METABOLIC PANEL
ANION GAP: 4 — AB (ref 5–15)
BUN: 16 mg/dL (ref 6–20)
CALCIUM: 9.1 mg/dL (ref 8.9–10.3)
CHLORIDE: 104 mmol/L (ref 101–111)
CO2: 27 mmol/L (ref 22–32)
Creatinine, Ser: 0.74 mg/dL (ref 0.44–1.00)
GFR calc non Af Amer: >60 mL/min (ref >60)
Glucose, Bld: 94 mg/dL (ref 65–99)
Potassium: 4 mmol/L (ref 3.5–5.1)
Sodium: 135 mmol/L (ref 135–145)

## 2015-12-07 MED ORDER — KETOROLAC TROMETHAMINE 60 MG/2ML IM SOLN
60.0000 mg | Freq: Once | INTRAMUSCULAR | Status: AC
  Administered 2015-12-07: 60 mg via INTRAMUSCULAR
  Filled 2015-12-07: qty 2

## 2015-12-07 MED ORDER — IBUPROFEN 600 MG PO TABS: 600.0000 mg | ORAL_TABLET | Freq: Four times a day (QID) | ORAL | Status: DC | PRN

## 2015-12-07 MED ORDER — POLYETHYLENE GLYCOL 3350 17 G PO PACK: 17.0000 g | PACK | Freq: Every day | ORAL | Status: DC

## 2015-12-07 NOTE — MAU Provider Note (Signed)
Chief Complaint:  No chief complaint on file.   First Provider Initiated Contact with Patient 12/07/15 1926      Debra Boyd is a 25 y.o. G1P1001 who presents to maternity admissions reporting severe worsening right lower quadrant pain.   Started at work this morning with some pain which worsened as the day progressed.  Did not take anything for it all day.  Reports nausea and constipation (x 1 week)..  States there is no risk for STDs and does not need testing. She reports no vaginal bleeding, vaginal itching/burning, urinary symptoms, h/a, dizziness, vomiting, or fever/chills.   Wants a quant HCG level. States has been doing them on herself for the past week or 2, thinking she is early pregnant. 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 The current episode started today. The onset quality is gradual. The problem occurs intermittently. The problem has been gradually worsening. The pain is located in the RLQ. The pain is at a severity of 10/10. The pain is severe. The quality of the pain is cramping and sharp. The abdominal pain does not radiate. Associated symptoms include constipation and nausea. Pertinent negatives include no anorexia, diarrhea, dysuria, fever, headaches, myalgias or vomiting. The pain is aggravated by movement. The pain is relieved by nothing. She has tried nothing for the symptoms.   Past Medical History: Past Medical History  Diagnosis Date  . Depression   . Pelvic inflammatory disease   . Gonorrhea   . Chlamydia   . Trichomonas   . Genital HSV     Past obstetric history: OB History  Gravida Para Term Preterm AB SAB TAB Ectopic Multiple Living  _0 # Outcome Date GA Lbr Len/2nd Weight Sex Delivery Anes PTL Lv  1 Term 10/10/09    F CS-LTranv EPI  Y      Past Surgical History: Past Surgical History  Procedure Laterality Date  . Cesarean section      Family  History: Family History  Problem Relation Age of Onset  . Diabetes Maternal Aunt   . Kidney disease Paternal Aunt   . Heart disease Maternal Grandmother   . Hypertension Maternal Grandmother   . Hyperlipidemia Maternal Grandmother   . Lupus Maternal Aunt     Social History: Social History  Substance Use Topics  . Smoking status: Former Smoker -- 0.50 packs/day    Types: Cigarettes  . Smokeless tobacco: Never Used  . Alcohol Use: No    Allergies: No Known Allergies  Meds:  Prescriptions prior to admission  Medication Sig Dispense Refill Last Dose  . valACYclovir (VALTREX) 1000 MG tablet Take 1,000 mg by mouth daily.   12/06/2015 at Unknown time    I have reviewed patient's Past Medical Hx, Surgical Hx, Family Hx, Social Hx, medications and allergies.  ROS:  Review of Systems  Constitutional: Negative for fever, chills and fatigue.  Gastrointestinal: Positive for nausea and constipation. Negative for vomiting, diarrhea and anorexia.  Genitourinary: Positive for pelvic pain. Negative for dysuria, vaginal bleeding and vaginal discharge.  Musculoskeletal: Negative for myalgias and back pain.  Neurological: Negative for headaches.   Other systems negative  Physical Exam  Patient Vitals for the past 24 hrs:  BP Temp Temp src Pulse Resp Height Weight  12/07/15 1917 116/64 mmHg 98.7 F (37.1 C) Oral 60 18 _1  (1.575 m) 68.584  kg (151 lb 3.2 oz)   Constitutional: Well-developed, well-nourished female in no acute distress.  Cardiovascular: normal rate and rhythm, no ectopy audible, S1 & S2 heard, no murmur Respiratory: normal effort, no distress. Lungs CTAB with no wheezes or crackles GI: Abd soft, tender over entire lower abdomen.  Nondistended.  No rebound, Slight guarding.  Bowel Sounds audible  MS: Extremities nontender, no edema, normal ROM Neurologic: Alert and oriented x 4.   Grossly nonfocal. GU: Neg CVAT. Skin:  Warm and Dry Psych:  Affect appropriate.  PELVIC  EXAM:  Cervix firm, anterior, neg CMT, uterus tender, nonenlarged, adnexa with tenderness bilaterally, slightly more on right than left, no enlargement or mass  Labs: Results for orders placed or performed during the hospital encounter of 12/07/15 (from the past 72 hour(s))  Urinalysis, Routine w reflex microscopic (not at [facility])     Status: None   Collection Time: 12/07/15  7:10 PM  Result Value Ref Range   Color, Urine YELLOW YELLOW   APPearance CLEAR CLEAR   Specific Gravity, Urine 1.015 1.005 - 1.030   pH 6.5 5.0 - 8.0   Glucose, UA NEGATIVE NEGATIVE mg/dL   Hgb urine dipstick NEGATIVE NEGATIVE   Bilirubin Urine NEGATIVE NEGATIVE   Ketones, ur NEGATIVE NEGATIVE mg/dL   Protein, ur NEGATIVE NEGATIVE mg/dL   Nitrite NEGATIVE NEGATIVE   Leukocytes, UA NEGATIVE NEGATIVE    Comment: MICROSCOPIC NOT DONE ON URINES WITH NEGATIVE PROTEIN, BLOOD, LEUKOCYTES, NITRITE, OR GLUCOSE <1000 mg/dL.  Pregnancy, urine POC     Status: None   Collection Time: 12/07/15  7:31 PM  Result Value Ref Range   Preg Test, Ur NEGATIVE NEGATIVE    Comment:        THE SENSITIVITY OF THIS METHODOLOGY IS >24 mIU/mL   CBC with Differential     Status: Abnormal   Collection Time: 12/07/15  7:52 PM  Result Value Ref Range   WBC 13.5 (H) 4.0 - 10.5 K/uL   RBC 3.48 (L) 3.87 - 5.11 MIL/uL   Hemoglobin 11.3 (L) 12.0 - 15.0 g/dL   HCT 33.8 (L) 36.0 - 46.0 %   MCV 97.1 78.0 - 100.0 fL   MCH 32.5 26.0 - 34.0 pg   MCHC 33.4 30.0 - 36.0 g/dL   RDW 12.0 11.5 - 15.5 %   Platelets 283 150 - 400 K/uL   Neutrophils Relative % 74 %   Neutro Abs 10.1 (H) 1.7 - 7.7 K/uL   Lymphocytes Relative 21 %   Lymphs Abs 2.8 0.7 - 4.0 K/uL   Monocytes Relative 4 %   Monocytes Absolute 0.5 0.1 - 1.0 K/uL   Eosinophils Relative 1 %   Eosinophils Absolute 0.1 0.0 - 0.7 K/uL   Basophils Relative 0 %   Basophils Absolute 0.0 0.0 - 0.1 K/uL  hCG, quantitative, pregnancy     Status: None   Collection Time: 12/07/15  7:52 PM  Result  Value Ref Range   hCG, Beta Chain, Quant, S <1 <5 mIU/mL    Comment:          GEST. AGE      CONC.  (mIU/mL)   <=1 WEEK        5 - 50     2 WEEKS       50 - 500     3 WEEKS       100 - 10,000     4 WEEKS     1,000 - 30,000  5 WEEKS     3,500 - 115,000   6-8 WEEKS     12,000 - 270,000    12 WEEKS     15,000 - 220,000        FEMALE AND NON-PREGNANT FEMALE:     LESS THAN 5 mIU/mL REPEATED TO VERIFY   Basic metabolic panel     Status: Abnormal   Collection Time: 12/07/15  7:52 PM  Result Value Ref Range   Sodium 135 135 - 145 mmol/L   Potassium 4.0 3.5 - 5.1 mmol/L   Chloride 104 101 - 111 mmol/L   CO2 27 22 - 32 mmol/L   Glucose, Bld 94 65 - 99 mg/dL   BUN 16 6 - 20 mg/dL   Creatinine, Ser 0.74 0.44 - 1.00 mg/dL   Calcium 9.1 8.9 - 10.3 mg/dL   GFR calc non Af Amer >60 >60 mL/min   GFR calc Af Amer >60 >60 mL/min    Comment: (NOTE) The eGFR has been calculated using the CKD EPI equation. This calculation has not been validated in all clinical situations. eGFR's persistently <60 mL/min signify possible Chronic Kidney Disease.    Anion gap 4 (L) 5 - 15    Imaging:  US Transvaginal Non-ob  12/07/2015  CLINICAL DATA:  Chronic right lower quadrant abdominal pain. Initial encounter. EXAM: TRANSABDOMINAL AND TRANSVAGINAL ULTRASOUND OF PELVIS DOPPLER ULTRASOUND OF OVARIES TECHNIQUE: Both transabdominal and transvaginal ultrasound examinations of the pelvis were performed. Transabdominal technique was performed for global imaging of the pelvis including uterus, ovaries, adnexal regions, and pelvic cul-de-sac. It was necessary to proceed with endovaginal exam following the transabdominal exam to visualize the ovaries and adnexa in greater detail. Color and duplex Doppler ultrasound was utilized to evaluate blood flow to the ovaries. COMPARISON:  Pelvic ultrasound performed 03/23/2015 FINDINGS: Uterus Measurements: 9.6 x 4.6 x 5.1 cm. No fibroids or other mass visualized. Endometrium  Thickness: 1.3 cm.  No focal abnormality visualized. Right ovary Measurements: 3.9 x 2.5 x 2.8 cm. A small 1.8 cm apparent complex mass adjacent to the right ovary may reflect focal extension of ovarian tissue. Left ovary Measurements: 2.4 x 1.7 x 2.3 cm. A 2.4 cm apparent complex mass adjacent to the left ovary may reflect focal extension of ovarian tissue. Pulsed Doppler evaluation of both ovaries demonstrates normal low-resistance arterial and venous waveforms. Other findings A small amount of free fluid is seen within the pelvic cul-de-sac. IMPRESSION: 1. No acute abnormality seen to explain the patient's symptoms. 2. Apparent small complex adnexal masses may simply reflect normal focal extension of ovarian tissue. If the patient's symptoms persist, MRI of the pelvis could be considered for further evaluation. Electronically Signed   By: Garald Balding M.D.   On: 12/07/2015 22:36   US Pelvis Complete  12/07/2015  CLINICAL DATA:  Chronic right lower quadrant abdominal pain. Initial encounter. EXAM: TRANSABDOMINAL AND TRANSVAGINAL ULTRASOUND OF PELVIS DOPPLER ULTRASOUND OF OVARIES TECHNIQUE: Both transabdominal and transvaginal ultrasound examinations of the pelvis were performed. Transabdominal technique was performed for global imaging of the pelvis including uterus, ovaries, adnexal regions, and pelvic cul-de-sac. It was necessary to proceed with endovaginal exam following the transabdominal exam to visualize the ovaries and adnexa in greater detail. Color and duplex Doppler ultrasound was utilized to evaluate blood flow to the ovaries. COMPARISON:  Pelvic ultrasound performed 03/23/2015 FINDINGS: Uterus Measurements: 9.6 x 4.6 x 5.1 cm. No fibroids or other mass visualized. Endometrium Thickness: 1.3 cm.  No focal abnormality visualized. Right ovary  Measurements: 3.9 x 2.5 x 2.8 cm. A small 1.8 cm apparent complex mass adjacent to the right ovary may reflect focal extension of ovarian tissue. Left ovary  Measurements: 2.4 x 1.7 x 2.3 cm. A 2.4 cm apparent complex mass adjacent to the left ovary may reflect focal extension of ovarian tissue. Pulsed Doppler evaluation of both ovaries demonstrates normal low-resistance arterial and venous waveforms. Other findings A small amount of free fluid is seen within the pelvic cul-de-sac. IMPRESSION: 1. No acute abnormality seen to explain the patient's symptoms. 2. Apparent small complex adnexal masses may simply reflect normal focal extension of ovarian tissue. If the patient's symptoms persist, MRI of the pelvis could be considered for further evaluation. Electronically Signed   By: Garald Balding M.D.   On: 12/07/2015 22:36   [place] Art/ven Flow Abd Pelv Doppler  12/07/2015  CLINICAL DATA:  Chronic right lower quadrant abdominal pain. Initial encounter. EXAM: TRANSABDOMINAL AND TRANSVAGINAL ULTRASOUND OF PELVIS DOPPLER ULTRASOUND OF OVARIES TECHNIQUE: Both transabdominal and transvaginal ultrasound examinations of the pelvis were performed. Transabdominal technique was performed for global imaging of the pelvis including uterus, ovaries, adnexal regions, and pelvic cul-de-sac. It was necessary to proceed with endovaginal exam following the transabdominal exam to visualize the ovaries and adnexa in greater detail. Color and duplex Doppler ultrasound was utilized to evaluate blood flow to the ovaries. COMPARISON:  Pelvic ultrasound performed 03/23/2015 FINDINGS: Uterus Measurements: 9.6 x 4.6 x 5.1 cm. No fibroids or other mass visualized. Endometrium Thickness: 1.3 cm.  No focal abnormality visualized. Right ovary Measurements: 3.9 x 2.5 x 2.8 cm. A small 1.8 cm apparent complex mass adjacent to the right ovary may reflect focal extension of ovarian tissue. Left ovary Measurements: 2.4 x 1.7 x 2.3 cm. A 2.4 cm apparent complex mass adjacent to the left ovary may reflect focal extension of ovarian tissue. Pulsed Doppler evaluation of both ovaries demonstrates normal  low-resistance arterial and venous waveforms. Other findings A small amount of free fluid is seen within the pelvic cul-de-sac. IMPRESSION: 1. No acute abnormality seen to explain the patient's symptoms. 2. Apparent small complex adnexal masses may simply reflect normal focal extension of ovarian tissue. If the patient's symptoms persist, MRI of the pelvis could be considered for further evaluation. Electronically Signed   By: Garald Balding M.D.   On: 12/07/2015 22:36    MAU Course/MDM: I have ordered labs as follows:CBC, diff, bmet, quant hcg (per patient insistence) Imaging ordered: Pelvic US with dopplers Results reviewed.   Consult Dr Elonda Husky.  He states while we could order pelvic US as outpatient, the amount of pain she has might warrant MAU [place]..   Treatments in MAU included Toradol IM.    Discussed US findings with Dr Elonda Husky. He states they are physiologic and will likely resolve in time with supportive care  Pt stable at time of discharge.  Assessment: Pelvic pain Bilateral functional hemorrhagic ovarian cysts Constipation  Plan: Discharge home Recommend Continue to monitor symptoms and eat a high fiber diet with plenty of water Rx sent for ibuprofen for  pain  Rx Miralax for constipation    Medication List    ASK your doctor about these medications        valACYclovir 1000 MG tablet  Commonly known as:  VALTREX  Take 1,000 mg by mouth daily.       Encouraged to return here or to other Urgent Care/ED if she develops worsening of symptoms, increase in pain, fever, or other concerning symptoms.  Hansel Feinstein CNM, MSN Certified Nurse-Midwife 12/07/2015 7:57 PM

## 2015-12-07 NOTE — Discharge Instructions (Signed)
Constipation, Adult Constipation is when a person has fewer than three bowel movements a week, has difficulty having a bowel movement, or has stools that are dry, hard, or larger than normal. As people grow older, constipation is more common. A low-fiber diet, not taking in enough fluids, and taking certain medicines may make constipation worse.  CAUSES   Certain medicines, such as antidepressants, pain medicine, iron supplements, antacids, and water pills.   Certain diseases, such as diabetes, irritable bowel syndrome (IBS), thyroid disease, or depression.   Not drinking enough water.   Not eating enough fiber-rich foods.   Stress or travel.   Lack of physical activity or exercise.   Ignoring the urge to have a bowel movement.   Using laxatives too much.  SIGNS AND SYMPTOMS   Having fewer than three bowel movements a week.   Straining to have a bowel movement.   Having stools that are hard, dry, or larger than normal.   Feeling full or bloated.   Pain in the lower abdomen.   Not feeling relief after having a bowel movement.  DIAGNOSIS  Your health care provider will take a medical history and perform a physical exam. Further testing may be done for severe constipation. Some tests may include:  A barium enema X-ray to examine your rectum, colon, and, sometimes, your small intestine.   A sigmoidoscopy to examine your lower colon.   A colonoscopy to examine your entire colon. TREATMENT  Treatment will depend on the severity of your constipation and what is causing it. Some dietary treatments include drinking more fluids and eating more fiber-rich foods. Lifestyle treatments may include regular exercise. If these diet and lifestyle recommendations do not help, your health care provider may recommend taking over-the-counter laxative medicines to help you have bowel movements. Prescription medicines may be prescribed if over-the-counter medicines do not work.    HOME CARE INSTRUCTIONS   Eat foods that have a lot of fiber, such as fruits, vegetables, whole grains, and beans.  Limit foods high in fat and processed sugars, such as french fries, hamburgers, cookies, candies, and soda.   A fiber supplement may be added to your diet if you cannot get enough fiber from foods.   Drink enough fluids to keep your urine clear or pale yellow.   Exercise regularly or as directed by your health care provider.   Go to the restroom when you have the urge to go. Do not hold it.   Only take over-the-counter or prescription medicines as directed by your health care provider. Do not take other medicines for constipation without talking to your health care provider first.  SEEK IMMEDIATE MEDICAL CARE IF:   You have bright red blood in your stool.   Your constipation lasts for more than 4 days or gets worse.   You have abdominal or rectal pain.   You have thin, pencil-like stools.   You have unexplained weight loss. MAKE SURE YOU:   Understand these instructions.  Will watch your condition.  Will get help right away if you are not doing well or get worse.   This information is not intended to replace advice given to you by your health care provider. Make sure you discuss any questions you have with your health care provider.   Document Released: 05/14/2004 Document Revised: 09/06/2014 Document Reviewed: 05/28/2013 Elsevier Interactive Patient Education 2016 Elsevier Inc. Ovarian Cyst An ovarian cyst is a sac filled with fluid or blood. This sac is attached to the ovary.  Some cysts go away on their own. Other cysts need treatment.  HOME CARE   Only take medicine as told by your doctor.  Follow up with your doctor as told.  Get regular pelvic exams and Pap tests. GET HELP IF:  Your periods are late, not regular, or painful.  You stop having periods.  Your belly (abdominal) or pelvic pain does not go away.  Your belly becomes large  or puffy (swollen).  You have a hard time peeing (totally emptying your bladder).  You have pressure on your bladder.  You have pain during sex.  You feel fullness, pressure, or discomfort in your belly.  You lose weight for no reason.  You feel sick most of the time.  You have a hard time pooping (constipation).  You do not feel like eating.  You develop pimples (acne).  You have an increase in hair on your body and face.  You are gaining weight for no reason.  You think you are pregnant. GET HELP RIGHT AWAY IF:   Your belly pain gets worse.  You feel sick to your stomach (nauseous), and you throw up (vomit).  You have a fever that comes on fast.  You have belly pain while pooping (bowel movement).  Your periods are heavier than usual. MAKE SURE YOU:   Understand these instructions.  Will watch your condition.  Will get help right away if you are not doing well or get worse.   This information is not intended to replace advice given to you by your health care provider. Make sure you discuss any questions you have with your health care provider.   Document Released: 02/02/2008 Document Revised: 06/06/2013 Document Reviewed: 04/23/2013 Elsevier Interactive Patient Education [employer] Else[employer] Inc[employer]

## 2015-12-07 NOTE — MAU Note (Signed)
Mild cramps since yestarday. Around noon today pain went from 4 to 8 now 10. Pain is coming in waves. LMP 11/30/15

## 2016-02-06 ENCOUNTER — Inpatient Hospital Stay (HOSPITAL_COMMUNITY)
Admission: AD | Admit: 2016-02-06 | Discharge: 2016-02-06 | Disposition: A | Discharge status: Home / Self Care | Payer: 59 | Source: Ambulatory Visit | Attending: Obstetrics & Gynecology | Admitting: Obstetrics & Gynecology

## 2016-02-06 ENCOUNTER — Encounter (HOSPITAL_COMMUNITY): Payer: Self-pay | Admitting: *Deleted

## 2016-02-06 DIAGNOSIS — B3731 Acute candidiasis of vulva and vagina: Secondary | ICD-10-CM

## 2016-02-06 DIAGNOSIS — N898 Other specified noninflammatory disorders of vagina: Secondary | ICD-10-CM

## 2016-02-06 DIAGNOSIS — Z3202 Encounter for pregnancy test, result negative: Secondary | ICD-10-CM | POA: Insufficient documentation

## 2016-02-06 DIAGNOSIS — Z87891 Personal history of nicotine dependence: Secondary | ICD-10-CM | POA: Diagnosis not present

## 2016-02-06 DIAGNOSIS — B373 Candidiasis of vulva and vagina: Secondary | ICD-10-CM | POA: Insufficient documentation

## 2016-02-06 DIAGNOSIS — L293 Anogenital pruritus, unspecified: Secondary | ICD-10-CM | POA: Diagnosis present

## 2016-02-06 LAB — WET PREP, GENITAL
Clue Cells Wet Prep HPF POC: NONE SEEN
TRICH WET PREP: NONE SEEN

## 2016-02-06 LAB — URINALYSIS, ROUTINE W REFLEX MICROSCOPIC
Bilirubin Urine: NEGATIVE
GLUCOSE, UA: NEGATIVE mg/dL
Hgb urine dipstick: NEGATIVE
KETONES UR: NEGATIVE mg/dL
Leukocytes, UA: NEGATIVE
Nitrite: NEGATIVE
PROTEIN: NEGATIVE mg/dL
Specific Gravity, Urine: 1.025 (ref 1.005–1.030)
pH: 6 (ref 5.0–8.0)

## 2016-02-06 LAB — POCT PREGNANCY, URINE: Preg Test, Ur: NEGATIVE

## 2016-02-06 MED ORDER — FLUCONAZOLE 150 MG PO TABS: ORAL_TABLET | ORAL | Status: DC

## 2016-02-06 NOTE — MAU Note (Addendum)
Pt C/O vaginal burning that started today during intercourse.  Has had vaginal itching x 3 days.  Denies dysuria.  Also lower abd pain for the last week.

## 2016-02-06 NOTE — Discharge Instructions (Signed)

## 2016-02-06 NOTE — MAU Provider Note (Signed)
History     CSN: [id number]  Arrival date and time: 02/06/16 1516   None     Chief Complaint  Patient presents with  . vaginal burning   . Vaginal Itching   HPI Debra Boyd is 25 y.o. G1P1001 Presents for evaluation of  vaginal burning that occurred today with intercourse.  She is a patient of [facility].  Vaginal itching began 3 days ago.  Has had intermittent lower abdominal pain X 1 week. Neg for pain at present.  She is actively trying to conceive.  She checked her cervical mucous 2 days ago and describes it as a discharge was thick, white and clumpy.  Neg for odor. She reports monthly cycles, trying to conceive since January.  LMP 01/15/16.  Past Medical History  Diagnosis Date  . Depression   . Pelvic inflammatory disease   . Gonorrhea   . Chlamydia   . Trichomonas   . Genital HSV     Past Surgical History  Procedure Laterality Date  . Cesarean section      Family History  Problem Relation Age of Onset  . Diabetes Maternal Aunt   . Kidney disease Paternal Aunt   . Heart disease Maternal Grandmother   . Hypertension Maternal Grandmother   . Hyperlipidemia Maternal Grandmother   . Lupus Maternal Aunt     Social History  Substance Use Topics  . Smoking status: Former Smoker -- 0.50 packs/day    Types: Cigarettes  . Smokeless tobacco: Never Used  . Alcohol Use: No    Allergies: No Known Allergies  Prescriptions prior to admission  Medication Sig Dispense Refill Last Dose  . valACYclovir (VALTREX) 1000 MG tablet Take 1,000 mg by mouth daily.   02/05/2016 at Unknown time  . ibuprofen (ADVIL,MOTRIN) 600 MG tablet Take 1 tablet (600 mg total) by mouth every 6 (six) hours as needed. (Patient not taking: Reported on 02/06/2016) 30 tablet 0   . polyethylene glycol (MIRALAX) packet Take 17 g by mouth daily. (Patient not taking: Reported on 02/06/2016) 14 each 0     Review of Systems  Constitutional: Negative for fever and chills.  Cardiovascular: Negative  for chest pain.  Gastrointestinal: Negative for nausea and vomiting. Abdominal pain: lower intermittent pain.  Genitourinary: Negative for dysuria, urgency, frequency and hematuria.       + for vaginal discharge.  Neg for vaginal bleeding  Neurological: Negative for headaches.   Physical Exam   Blood pressure 105/60, pulse 69, temperature 98.3 F (36.8 C), temperature source Oral, resp. rate 18, height 5\' 3"  (1.6 m), weight 149 lb (67.586 kg), last menstrual period 01/15/2016.  Physical Exam  Constitutional: She is oriented to person, place, and time. She appears well-developed and well-nourished. No distress.  HENT:  Head: Normocephalic.  Neck: Normal range of motion.  Cardiovascular: Normal rate.   Respiratory: Effort normal.  GI: Soft. She exhibits no distension and no mass. There is no tenderness. There is no rebound and no guarding.  Genitourinary: There is no tenderness, lesion or injury on the right labia. There is no tenderness, lesion or injury on the left labia. No erythema, tenderness or bleeding in the vagina. No foreign body around the vagina. Vaginal discharge (small amount of white discharge without odor. ) found.  Neurological: She is alert and oriented to person, place, and time.  Skin: Skin is warm and dry.  Psychiatric: She has a normal mood and affect. Her behavior is normal. Thought content normal.   Results  for orders placed or performed during the hospital encounter of 02/06/16 (from the past 24 hour(s))  Urinalysis, Routine w reflex microscopic (not at [facility])     Status: None   Collection Time: 02/06/16  3:35 PM  Result Value Ref Range   Color, Urine YELLOW YELLOW   APPearance CLEAR CLEAR   Specific Gravity, Urine 1.025 1.005 - 1.030   pH 6.0 5.0 - 8.0   Glucose, UA NEGATIVE NEGATIVE mg/dL   Hgb urine dipstick NEGATIVE NEGATIVE   Bilirubin Urine NEGATIVE NEGATIVE   Ketones, ur NEGATIVE NEGATIVE mg/dL   Protein, ur NEGATIVE NEGATIVE mg/dL   Nitrite NEGATIVE  NEGATIVE   Leukocytes, UA NEGATIVE NEGATIVE  Pregnancy, urine POC     Status: None   Collection Time: 02/06/16  3:39 PM  Result Value Ref Range   Preg Test, Ur NEGATIVE NEGATIVE  Wet prep, genital     Status: Abnormal   Collection Time: 02/06/16  4:05 PM  Result Value Ref Range   Yeast Wet Prep HPF POC PRESENT (A) NONE SEEN   Trich, Wet Prep NONE SEEN NONE SEEN   Clue Cells Wet Prep HPF POC NONE SEEN NONE SEEN   WBC, Wet Prep HPF POC MANY (A) NONE SEEN   Sperm PRESENT    MAU Course  Procedures GC/CHL culture and HIV pending  MDM MSE Exam Lab  Assessment and Plan  A:  Vaginal discharge     Candidiasis of the vagina  P: Rx for Diflucan to pharmacy   Debra Boyd,EVE M 02/06/2016, 4:43 PM

## 2016-02-07 LAB — HIV ANTIBODY (ROUTINE TESTING W REFLEX): HIV Screen 4th Generation wRfx: NONREACTIVE

## 2016-02-09 LAB — GC/CHLAMYDIA PROBE AMP (~~LOC~~) NOT AT ARMC
Chlamydia: NEGATIVE
Neisseria Gonorrhea: NEGATIVE

## 2016-03-17 ENCOUNTER — Ambulatory Visit (INDEPENDENT_AMBULATORY_CARE_PROVIDER_SITE_OTHER): Payer: 59 | Admitting: Obstetrics & Gynecology

## 2016-03-17 ENCOUNTER — Encounter: Payer: Self-pay | Admitting: Obstetrics & Gynecology

## 2016-03-17 VITALS — BP 103/70 | HR 59 | Temp 98.7°F | Ht 62.75 in | Wt 147.4 lb

## 2016-03-17 DIAGNOSIS — Z113 Encounter for screening for infections with a predominantly sexual mode of transmission: Secondary | ICD-10-CM

## 2016-03-17 DIAGNOSIS — N979 Female infertility, unspecified: Secondary | ICD-10-CM

## 2016-03-17 DIAGNOSIS — Z124 Encounter for screening for malignant neoplasm of cervix: Secondary | ICD-10-CM | POA: Diagnosis not present

## 2016-03-17 DIAGNOSIS — N760 Acute vaginitis: Secondary | ICD-10-CM

## 2016-03-17 DIAGNOSIS — Z01419 Encounter for gynecological examination (general) (routine) without abnormal findings: Secondary | ICD-10-CM

## 2016-03-17 NOTE — Progress Notes (Signed)
Patient ID: Debra Boyd, female   DOB: [birth year], 25 y.o.   MRN: [id number]     GYNECOLOGY CLINIC ANNUAL PREVENTATIVE CARE ENCOUNTER NOTE  Subjective:   Debra Boyd is a 25 y.o. G20P1001 female here for a routine annual gynecologic exam.  Current complaints: wants evaluation of vaginal discharge for STIs, also had recent yeast infection and wants to see if it has resolved as she is still having mild vaginal irritation.  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   She has been trying to get pregnant for about 15 months. Has regular monthly periods and has intercourse every day; has not used ovulation predictor kits as "I am having sex every day anyway".  Wants evaluation for secondary infertility, and wants evaluation for ovarian cysts as she has a history of these.     Denies abnormal vaginal bleeding, pelvic pain, problems with intercourse or other gynecologic concerns.    Gynecologic History Patient's last menstrual period was 03/10/2016 (exact date). Contraception: none Last Pap: 08/2013. Results were: normal  Obstetric History OB History  Gravida Para Term Preterm AB SAB TAB Ectopic Multiple Living  # Outcome Date GA Lbr Len/2nd Weight Sex Delivery Anes PTL Lv  1 Term 10/10/09    F CS-LTranv EPI  Y      Past Medical History  Diagnosis Date  . Depression   . Pelvic inflammatory disease   . Gonorrhea   . Chlamydia   . Trichomonas   . Genital HSV     Past Surgical History  Procedure Laterality Date  . Cesarean section      Current Outpatient Prescriptions on File Prior to Visit  Medication Sig Dispense Refill  . valACYclovir (VALTREX) 1000 MG tablet Take 1,000 mg by mouth daily.    Marland Kitchen ibuprofen (ADVIL,MOTRIN) 600 MG tablet Take 1 tablet (600 mg total) by mouth every 6 (six) hours as needed. (Patient not taking: Reported on 02/06/2016) 30 tablet 0  . polyethylene glycol (MIRALAX) packet Take 17 g by  mouth daily. (Patient not taking: Reported on 02/06/2016) 14 each 0   No current facility-administered medications on file prior to visit.    No Known Allergies  Social History   Social History  . Marital Status: Divorced    Spouse Name: N/A  . Number of Children: N/A  . Years of Education: N/A   Occupational History  . Not on file.   Social History Main Topics  . Smoking status: Former Smoker -- 0.50 packs/day    Types: Cigarettes  . Smokeless tobacco: Never Used  . Alcohol Use: No  . Drug Use: Yes    Special: Marijuana     Comment: last used a couple weeks ago  . Sexual Activity: Not Currently    Birth Control/ Protection: None   Other Topics Concern  . Not on file   Social History Narrative    Family History  Problem Relation Age of Onset  . Diabetes Maternal Aunt   . Kidney disease Paternal Aunt   . Heart disease Maternal Grandmother   . Hypertension Maternal Grandmother   . Hyperlipidemia Maternal Grandmother   . Lupus Maternal Aunt     The following portions of the patient's history were reviewed and updated as appropriate: allergies, current medications, past family history, past medical history, past social history, past surgical history and problem list.  Review of Systems  Pertinent items noted in HPI and remainder of comprehensive ROS otherwise negative.   Objective:  BP 103/70 mmHg  Pulse 59  Temp(Src) 98.7 F (37.1 C) (Oral)  Ht 5' 2.75" (1.594 m)  Wt 147 lb 6.4 oz (66.86 kg)  BMI 26.31 kg/m2  LMP 03/10/2016 (Exact Date) CONSTITUTIONAL: Well-developed, well-nourished female in no acute distress.  HENT:  Normocephalic, atraumatic, External right and left ear normal. Oropharynx is clear and moist EYES: Conjunctivae and EOM are normal. Pupils are equal, round, and reactive to light. No scleral icterus.  NECK: Normal range of motion, supple, no masses.  Normal thyroid.  SKIN: Skin is warm and dry. No rash noted. Not diaphoretic. No erythema. No  pallor. NEUROLOGIC: Alert and oriented to person, place, and time. Normal reflexes, muscle tone coordination. No cranial nerve deficit noted. PSYCHIATRIC: Normal mood and affect. Normal behavior. Normal judgment and thought content. CARDIOVASCULAR: Normal heart rate noted, regular rhythm RESPIRATORY: Clear to auscultation bilaterally. Effort and breath sounds normal, no problems with respiration noted. BREASTS: Symmetric in size. No masses, skin changes, nipple drainage, or lymphadenopathy. ABDOMEN: Soft, normal bowel sounds, no distention noted.  No tenderness, rebound or guarding.  PELVIC: Normal appearing external genitalia; normal appearing vaginal mucosa and cervix.  No abnormal discharge noted.  Pap smear and NuSwab samples obtained.  Normal uterine size, no other palpable masses, no uterine or adnexal tenderness. Right adnexal fullness appreciated, more than left. MUSCULOSKELETAL: Normal range of motion. No tenderness.  No cyanosis, clubbing, or edema.  2+ distal pulses.   Assessment:  Annual gynecologic examination with pap smear Desires cervicovaginal STI screen Secondary infertility   Plan:  Will follow up results of pap smear and NuSwab and manage accordingly. Infertility specialist referral done; advised trial of OTC ovulation predictor kits. Will get imaging modality during infertility evaluation to investigate possible ovarian cysts, will defer ordering imaging for now.  Routine preventative health maintenance measures emphasized. Please refer to After Visit Summary for other counseling recommendations.    Jaynie CollinsUGONNA  Morrell Fluke, MD, FACOG Attending Obstetrician & Gynecologist, [facility]

## 2016-03-17 NOTE — Patient Instructions (Signed)
Preventive Care for Adults, Female A healthy lifestyle and preventive care can promote health and wellness. Preventive health guidelines for women include the following key practices.  A routine yearly physical is a good way to check with your health care provider about your health and preventive screening. It is a chance to share any concerns and updates on your health and to receive a thorough exam.  Visit your dentist for a routine exam and preventive care every 6 months. Brush your teeth twice a day and floss once a day. Good oral hygiene prevents tooth decay and gum disease.  The frequency of eye exams is based on your age, health, family medical history, use of contact lenses, and other factors. Follow your health care provider's recommendations for frequency of eye exams.  Eat a healthy diet. Foods like vegetables, fruits, whole grains, low-fat dairy products, and lean protein foods contain the nutrients you need without too many calories. Decrease your intake of foods high in solid fats, added sugars, and salt. Eat the right amount of calories for you.Get information about a proper diet from your health care provider, if necessary.  Regular physical exercise is one of the most important things you can do for your health. Most adults should get at least 150 minutes of moderate-intensity exercise (any activity that increases your heart rate and causes you to sweat) each week. In addition, most adults need muscle-strengthening exercises on 2 or more days a week.  Maintain a healthy weight. The body mass index (BMI) is a screening tool to identify possible weight problems. It provides an estimate of body fat based on height and weight. Your health care provider can find your BMI and can help you achieve or maintain a healthy weight.For adults 20 years and older:  A BMI below 18.5 is considered underweight.  A BMI of 18.5 to 24.9 is normal.  A BMI of 25 to 29.9 is considered  overweight.  A BMI of 30 and above is considered obese.  Maintain normal blood lipids and cholesterol levels by exercising and minimizing your intake of saturated fat. Eat a balanced diet with plenty of fruit and vegetables. Blood tests for lipids and cholesterol should begin at age 70 and be repeated every 5 years. If your lipid or cholesterol levels are high, you are over 50, or you are at high risk for heart disease, you may need your cholesterol levels checked more frequently.Ongoing high lipid and cholesterol levels should be treated with medicines if diet and exercise are not working.  If you smoke, find out from your health care provider how to quit. If you do not use tobacco, do not start.  Lung cancer screening is recommended for adults aged 41-80 years who are at high risk for developing lung cancer because of a history of smoking. A yearly low-dose CT scan of the lungs is recommended for people who have at least a 30-pack-year history of smoking and are a current smoker or have quit within the past 15 years. A pack year of smoking is smoking an average of 1 pack of cigarettes a day for 1 year (for example: 1 pack a day for 30 years or 2 packs a day for 15 years). Yearly screening should continue until the smoker has stopped smoking for at least 15 years. Yearly screening should be stopped for people who develop a health problem that would prevent them from having lung cancer treatment.  If you are pregnant, do not drink alcohol. If you are  breastfeeding, be very cautious about drinking alcohol. If you are not pregnant and choose to drink alcohol, do not have more than 1 drink per day. One drink is considered to be 12 ounces (355 mL) of beer, 5 ounces (148 mL) of wine, or 1.5 ounces (44 mL) of liquor.  Avoid use of street drugs. Do not share needles with anyone. Ask for help if you need support or instructions about stopping the use of drugs.  High blood pressure causes heart disease and  increases the risk of stroke. Your blood pressure should be checked at least every 1 to 2 years. Ongoing high blood pressure should be treated with medicines if weight loss and exercise do not work.  If you are 25-78 years old, ask your health care provider if you should take aspirin to prevent strokes.  Diabetes screening is done by taking a blood sample to check your blood glucose level after you have not eaten for a certain period of time (fasting). If you are not overweight and you do not have risk factors for diabetes, you should be screened once every 3 years starting at age 86. If you are overweight or obese and you are 3-87 years of age, you should be screened for diabetes every year as part of your cardiovascular risk assessment.  Breast cancer screening is essential preventive care for women. You should practice "breast self-awareness." This means understanding the normal appearance and feel of your breasts and may include breast self-examination. Any changes detected, no matter how small, should be reported to a health care provider. Women in their 66s and 30s should have a clinical breast exam (CBE) by a health care provider as part of a regular health exam every 1 to 3 years. After age 43, women should have a CBE every year. Starting at age 37, women should consider having a mammogram (breast X-ray test) every year. Women who have a family history of breast cancer should talk to their health care provider about genetic screening. Women at a high risk of breast cancer should talk to their health care providers about having an MRI and a mammogram every year.  Breast cancer gene (BRCA)-related cancer risk assessment is recommended for women who have family members with BRCA-related cancers. BRCA-related cancers include breast, ovarian, tubal, and peritoneal cancers. Having family members with these cancers may be associated with an increased risk for harmful changes (mutations) in the breast  cancer genes BRCA1 and BRCA2. Results of the assessment will determine the need for genetic counseling and BRCA1 and BRCA2 testing.  Your health care provider may recommend that you be screened regularly for cancer of the pelvic organs (ovaries, uterus, and vagina). This screening involves a pelvic examination, including checking for microscopic changes to the surface of your cervix (Pap test). You may be encouraged to have this screening done every 3 years, beginning at age 78.  For women ages 79-65, health care providers may recommend pelvic exams and Pap testing every 3 years, or they may recommend the Pap and pelvic exam, combined with testing for human papilloma virus (HPV), every 5 years. Some types of HPV increase your risk of cervical cancer. Testing for HPV may also be done on women of any age with unclear Pap test results.  Other health care providers may not recommend any screening for nonpregnant women who are considered low risk for pelvic cancer and who do not have symptoms. Ask your health care provider if a screening pelvic exam is right for  you.  If you have had past treatment for cervical cancer or a condition that could lead to cancer, you need Pap tests and screening for cancer for at least 20 years after your treatment. If Pap tests have been discontinued, your risk factors (such as having a new sexual partner) need to be reassessed to determine if screening should resume. Some women have medical problems that increase the chance of getting cervical cancer. In these cases, your health care provider may recommend more frequent screening and Pap tests.  Colorectal cancer can be detected and often prevented. Most routine colorectal cancer screening begins at the age of 50 years and continues through age 75 years. However, your health care provider may recommend screening at an earlier age if you have risk factors for colon cancer. On a yearly basis, your health care provider may provide  home test kits to check for hidden blood in the stool. Use of a small camera at the end of a tube, to directly examine the colon (sigmoidoscopy or colonoscopy), can detect the earliest forms of colorectal cancer. Talk to your health care provider about this at age 50, when routine screening begins. Direct exam of the colon should be repeated every 5-10 years through age 75 years, unless early forms of precancerous polyps or small growths are found.  People who are at an increased risk for hepatitis B should be screened for this virus. You are considered at high risk for hepatitis B if:  You were born in a country where hepatitis B occurs often. Talk with your health care provider about which countries are considered high risk.  Your parents were born in a high-risk country and you have not received a shot to protect against hepatitis B (hepatitis B vaccine).  You have HIV or AIDS.  You use needles to inject street drugs.  You live with, or have sex with, someone who has hepatitis B.  You get hemodialysis treatment.  You take certain medicines for conditions like cancer, organ transplantation, and autoimmune conditions.  Hepatitis C blood testing is recommended for all people born from 1945 through 1965 and any individual with known risks for hepatitis C.  Practice safe sex. Use condoms and avoid high-risk sexual practices to reduce the spread of sexually transmitted infections (STIs). STIs include gonorrhea, chlamydia, syphilis, trichomonas, herpes, HPV, and human immunodeficiency virus (HIV). Herpes, HIV, and HPV are viral illnesses that have no cure. They can result in disability, cancer, and death.  You should be screened for sexually transmitted illnesses (STIs) including gonorrhea and chlamydia if:  You are sexually active and are younger than 24 years.  You are older than 24 years and your health care provider tells you that you are at risk for this type of infection.  Your sexual  activity has changed since you were last screened and you are at an increased risk for chlamydia or gonorrhea. Ask your health care provider if you are at risk.  If you are at risk of being infected with HIV, it is recommended that you take a prescription medicine daily to prevent HIV infection. This is called preexposure prophylaxis (PrEP). You are considered at risk if:  You are sexually active and do not regularly use condoms or know the HIV status of your partner(s).  You take drugs by injection.  You are sexually active with a partner who has HIV.  Talk with your health care provider about whether you are at high risk of being infected with HIV. If   you choose to begin PrEP, you should first be tested for HIV. You should then be tested every 3 months for as long as you are taking PrEP.  Osteoporosis is a disease in which the bones lose minerals and strength with aging. This can result in serious bone fractures or breaks. The risk of osteoporosis can be identified using a bone density scan. Women ages 1 years and over and women at risk for fractures or osteoporosis should discuss screening with their health care providers. Ask your health care provider whether you should take a calcium supplement or vitamin D to reduce the rate of osteoporosis.  Menopause can be associated with physical symptoms and risks. Hormone replacement therapy is available to decrease symptoms and risks. You should talk to your health care provider about whether hormone replacement therapy is right for you.  Use sunscreen. Apply sunscreen liberally and repeatedly throughout the day. You should seek shade when your shadow is shorter than you. Protect yourself by wearing long sleeves, pants, a wide-brimmed hat, and sunglasses year round, whenever you are outdoors.  Once a month, do a whole body skin exam, using a mirror to look at the skin on your back. Tell your health care provider of new moles, moles that have irregular  borders, moles that are larger than a pencil eraser, or moles that have changed in shape or color.  Stay current with required vaccines (immunizations).  Influenza vaccine. All adults should be immunized every year.  Tetanus, diphtheria, and acellular pertussis (Td, Tdap) vaccine. Pregnant women should receive 1 dose of Tdap vaccine during each pregnancy. The dose should be obtained regardless of the length of time since the last dose. Immunization is preferred during the 27th-36th week of gestation. An adult who has not previously received Tdap or who does not know her vaccine status should receive 1 dose of Tdap. This initial dose should be followed by tetanus and diphtheria toxoids (Td) booster doses every 10 years. Adults with an unknown or incomplete history of completing a 3-dose immunization series with Td-containing vaccines should begin or complete a primary immunization series including a Tdap dose. Adults should receive a Td booster every 10 years.  Varicella vaccine. An adult without evidence of immunity to varicella should receive 2 doses or a second dose if she has previously received 1 dose. Pregnant females who do not have evidence of immunity should receive the first dose after pregnancy. This first dose should be obtained before leaving the health care facility. The second dose should be obtained 4-8 weeks after the first dose.  Human papillomavirus (HPV) vaccine. Females aged 13-26 years who have not received the vaccine previously should obtain the 3-dose series. The vaccine is not recommended for use in pregnant females. However, pregnancy testing is not needed before receiving a dose. If a female is found to be pregnant after receiving a dose, no treatment is needed. In that case, the remaining doses should be delayed until after the pregnancy. Immunization is recommended for any person with an immunocompromised condition through the age of 24 years if she did not get any or all doses  earlier. During the 3-dose series, the second dose should be obtained 4-8 weeks after the first dose. The third dose should be obtained 24 weeks after the first dose and 16 weeks after the second dose.  Zoster vaccine. One dose is recommended for adults aged 97 years or older unless certain conditions are present.  Measles, mumps, and rubella (MMR) vaccine. Adults born  before 1957 generally are considered immune to measles and mumps. Adults born in [birth year] or later should have 1 or more doses of MMR vaccine unless there is a contraindication to the vaccine or there is laboratory evidence of immunity to each of the three diseases. A routine second dose of MMR vaccine should be obtained at least 28 days after the first dose for students attending postsecondary schools, health care workers, or international travelers. People who received inactivated measles vaccine or an unknown type of measles vaccine during 1963-1967 should receive 2 doses of MMR vaccine. People who received inactivated mumps vaccine or an unknown type of mumps vaccine before 1979 and are at high risk for mumps infection should consider immunization with 2 doses of MMR vaccine. For females of childbearing age, rubella immunity should be determined. If there is no evidence of immunity, females who are not pregnant should be vaccinated. If there is no evidence of immunity, females who are pregnant should delay immunization until after pregnancy. Unvaccinated health care workers born before 60 who lack laboratory evidence of measles, mumps, or rubella immunity or laboratory confirmation of disease should consider measles and mumps immunization with 2 doses of MMR vaccine or rubella immunization with 1 dose of MMR vaccine.  Pneumococcal 13-valent conjugate (PCV13) vaccine. When indicated, a person who is uncertain of his immunization history and has no record of immunization should receive the PCV13 vaccine. All adults 61 years of age and older  should receive this vaccine. An adult aged 92 years or older who has certain medical conditions and has not been previously immunized should receive 1 dose of PCV13 vaccine. This PCV13 should be followed with a dose of pneumococcal polysaccharide (PPSV23) vaccine. Adults who are at high risk for pneumococcal disease should obtain the PPSV23 vaccine at least 8 weeks after the dose of PCV13 vaccine. Adults older than 26 years of age who have normal immune system function should obtain the PPSV23 vaccine dose at least 1 year after the dose of PCV13 vaccine.  Pneumococcal polysaccharide (PPSV23) vaccine. When PCV13 is also indicated, PCV13 should be obtained first. All adults aged 2 years and older should be immunized. An adult younger than age 30 years who has certain medical conditions should be immunized. Any person who resides in a nursing home or long-term care facility should be immunized. An adult smoker should be immunized. People with an immunocompromised condition and certain other conditions should receive both PCV13 and PPSV23 vaccines. People with human immunodeficiency virus (HIV) infection should be immunized as soon as possible after diagnosis. Immunization during chemotherapy or radiation therapy should be avoided. Routine use of PPSV23 vaccine is not recommended for American Indians, [place] Natives, or people younger than 65 years unless there are medical conditions that require PPSV23 vaccine. When indicated, people who have unknown immunization and have no record of immunization should receive PPSV23 vaccine. One-time revaccination 5 years after the first dose of PPSV23 is recommended for people aged 19-64 years who have chronic kidney failure, nephrotic syndrome, asplenia, or immunocompromised conditions. People who received 1-2 doses of PPSV23 before age 44 years should receive another dose of PPSV23 vaccine at age 83 years or later if at least 5 years have passed since the previous dose. Doses  of PPSV23 are not needed for people immunized with PPSV23 at or after age 20 years.  Meningococcal vaccine. Adults with asplenia or persistent complement component deficiencies should receive 2 doses of quadrivalent meningococcal conjugate (MenACWY-D) vaccine. The doses should be obtained  at least 2 months apart. Microbiologists working with certain meningococcal bacteria, [employer] recruits, people at risk during an outbreak, and people who travel to or live in countries with a high rate of meningitis should be immunized. A first-year college student up through age 64 years who is living in a residence hall should receive a dose if she did not receive a dose on or after her 16th birthday. Adults who have certain high-risk conditions should receive one or more doses of vaccine.  Hepatitis A vaccine. Adults who wish to be protected from this disease, have certain high-risk conditions, work with hepatitis A-infected animals, work in hepatitis A research labs, or travel to or work in countries with a high rate of hepatitis A should be immunized. Adults who were previously unvaccinated and who anticipate close contact with an international adoptee during the first 60 days after arrival in the [place] from a country with a high rate of hepatitis A should be immunized.  Hepatitis B vaccine. Adults who wish to be protected from this disease, have certain high-risk conditions, may be exposed to blood or other infectious body fluids, are household contacts or sex partners of hepatitis B positive people, are clients or workers in certain care facilities, or travel to or work in countries with a high rate of hepatitis B should be immunized.  Haemophilus influenzae type b (Hib) vaccine. A previously unvaccinated person with asplenia or sickle cell disease or having a scheduled splenectomy should receive 1 dose of Hib vaccine. Regardless of previous immunization, a recipient of a hematopoietic stem cell transplant  should receive a 3-dose series 6-12 months after her successful transplant. Hib vaccine is not recommended for adults with HIV infection. Preventive Services / Frequency Ages 47 to 20 years  Blood pressure check.** / Every 3-5 years.  Lipid and cholesterol check.** / Every 5 years beginning at age 48.  Clinical breast exam.** / Every 3 years for women in their 76s and 20s.  BRCA-related cancer risk assessment.** / For women who have family members with a BRCA-related cancer (breast, ovarian, tubal, or peritoneal cancers).  Pap test.** / Every 2 years from ages 69 through 68. Every 3 years starting at age 47 through age 70 or 54 with a history of 3 consecutive normal Pap tests.  HPV screening.** / Every 3 years from ages 5 through ages 78 to 74 with a history of 3 consecutive normal Pap tests.  Hepatitis C blood test.** / For any individual with known risks for hepatitis C.  Skin self-exam. / Monthly.  Influenza vaccine. / Every year.  Tetanus, diphtheria, and acellular pertussis (Tdap, Td) vaccine.** / Consult your health care provider. Pregnant women should receive 1 dose of Tdap vaccine during each pregnancy. 1 dose of Td every 10 years.  Varicella vaccine.** / Consult your health care provider. Pregnant females who do not have evidence of immunity should receive the first dose after pregnancy.  HPV vaccine. / 3 doses over 6 months, if 34 and younger. The vaccine is not recommended for use in pregnant females. However, pregnancy testing is not needed before receiving a dose.  Measles, mumps, rubella (MMR) vaccine.** / You need at least 1 dose of MMR if you were born in 1957 or later. You may also need a 2nd dose. For females of childbearing age, rubella immunity should be determined. If there is no evidence of immunity, females who are not pregnant should be vaccinated. If there is no evidence of immunity, females who are  pregnant should delay immunization until after  pregnancy.  Pneumococcal 13-valent conjugate (PCV13) vaccine.** / Consult your health care provider.  Pneumococcal polysaccharide (PPSV23) vaccine.** / 1 to 2 doses if you smoke cigarettes or if you have certain conditions.  Meningococcal vaccine.** / 1 dose if you are age 87 to 44 years and a Market researcher living in a residence hall, or have one of several medical conditions, you need to get vaccinated against meningococcal disease. You may also need additional booster doses.  Hepatitis A vaccine.** / Consult your health care provider.  Hepatitis B vaccine.** / Consult your health care provider.  Haemophilus influenzae type b (Hib) vaccine.** / Consult your health care provider. Ages 86 to 38 years  Blood pressure check.** / Every year.  Lipid and cholesterol check.** / Every 5 years beginning at age 49 years.  Lung cancer screening. / Every year if you are aged 71-80 years and have a 30-pack-year history of smoking and currently smoke or have quit within the past 15 years. Yearly screening is stopped once you have quit smoking for at least 15 years or develop a health problem that would prevent you from having lung cancer treatment.  Clinical breast exam.** / Every year after age 51 years.  BRCA-related cancer risk assessment.** / For women who have family members with a BRCA-related cancer (breast, ovarian, tubal, or peritoneal cancers).  Mammogram.** / Every year beginning at age 18 years and continuing for as long as you are in good health. Consult with your health care provider.  Pap test.** / Every 3 years starting at age 63 years through age 37 or 57 years with a history of 3 consecutive normal Pap tests.  HPV screening.** / Every 3 years from ages 41 years through ages 76 to 23 years with a history of 3 consecutive normal Pap tests.  Fecal occult blood test (FOBT) of stool. / Every year beginning at age 36 years and continuing until age 51 years. You may not need  to do this test if you get a colonoscopy every 10 years.  Flexible sigmoidoscopy or colonoscopy.** / Every 5 years for a flexible sigmoidoscopy or every 10 years for a colonoscopy beginning at age 36 years and continuing until age 35 years.  Hepatitis C blood test.** / For all people born from [birth year] through 1965 and any individual with known risks for hepatitis C.  Skin self-exam. / Monthly.  Influenza vaccine. / Every year.  Tetanus, diphtheria, and acellular pertussis (Tdap/Td) vaccine.** / Consult your health care provider. Pregnant women should receive 1 dose of Tdap vaccine during each pregnancy. 1 dose of Td every 10 years.  Varicella vaccine.** / Consult your health care provider. Pregnant females who do not have evidence of immunity should receive the first dose after pregnancy.  Zoster vaccine.** / 1 dose for adults aged 73 years or older.  Measles, mumps, rubella (MMR) vaccine.** / You need at least 1 dose of MMR if you were born in 1957 or later. You may also need a second dose. For females of childbearing age, rubella immunity should be determined. If there is no evidence of immunity, females who are not pregnant should be vaccinated. If there is no evidence of immunity, females who are pregnant should delay immunization until after pregnancy.  Pneumococcal 13-valent conjugate (PCV13) vaccine.** / Consult your health care provider.  Pneumococcal polysaccharide (PPSV23) vaccine.** / 1 to 2 doses if you smoke cigarettes or if you have certain conditions.  Meningococcal vaccine.** /  Consult your health care provider.  Hepatitis A vaccine.** / Consult your health care provider.  Hepatitis B vaccine.** / Consult your health care provider.  Haemophilus influenzae type b (Hib) vaccine.** / Consult your health care provider. Ages 80 years and over  Blood pressure check.** / Every year.  Lipid and cholesterol check.** / Every 5 years beginning at age 62 years.  Lung cancer  screening. / Every year if you are aged 32-80 years and have a 30-pack-year history of smoking and currently smoke or have quit within the past 15 years. Yearly screening is stopped once you have quit smoking for at least 15 years or develop a health problem that would prevent you from having lung cancer treatment.  Clinical breast exam.** / Every year after age 61 years.  BRCA-related cancer risk assessment.** / For women who have family members with a BRCA-related cancer (breast, ovarian, tubal, or peritoneal cancers).  Mammogram.** / Every year beginning at age 39 years and continuing for as long as you are in good health. Consult with your health care provider.  Pap test.** / Every 3 years starting at age 85 years through age 74 or 72 years with 3 consecutive normal Pap tests. Testing can be stopped between 65 and 70 years with 3 consecutive normal Pap tests and no abnormal Pap or HPV tests in the past 10 years.  HPV screening.** / Every 3 years from ages 55 years through ages 67 or 77 years with a history of 3 consecutive normal Pap tests. Testing can be stopped between 65 and 70 years with 3 consecutive normal Pap tests and no abnormal Pap or HPV tests in the past 10 years.  Fecal occult blood test (FOBT) of stool. / Every year beginning at age 81 years and continuing until age 22 years. You may not need to do this test if you get a colonoscopy every 10 years.  Flexible sigmoidoscopy or colonoscopy.** / Every 5 years for a flexible sigmoidoscopy or every 10 years for a colonoscopy beginning at age 67 years and continuing until age 22 years.  Hepatitis C blood test.** / For all people born from [birth year] through 1966 and any individual with known risks for hepatitis C.  Osteoporosis screening.** / A one-time screening for women ages 8 years and over and women at risk for fractures or osteoporosis.  Skin self-exam. / Monthly.  Influenza vaccine. / Every year.  Tetanus, diphtheria, and  acellular pertussis (Tdap/Td) vaccine.** / 1 dose of Td every 10 years.  Varicella vaccine.** / Consult your health care provider.  Zoster vaccine.** / 1 dose for adults aged 56 years or older.  Pneumococcal 13-valent conjugate (PCV13) vaccine.** / Consult your health care provider.  Pneumococcal polysaccharide (PPSV23) vaccine.** / 1 dose for all adults aged 15 years and older.  Meningococcal vaccine.** / Consult your health care provider.  Hepatitis A vaccine.** / Consult your health care provider.  Hepatitis B vaccine.** / Consult your health care provider.  Haemophilus influenzae type b (Hib) vaccine.** / Consult your health care provider. ** Family history and personal history of risk and conditions may change your health care provider's recommendations.   This information is not intended to replace advice given to you by your health care provider. Make sure you discuss any questions you have with your health care provider.   Document Released: 10/12/2001 Document Revised: 09/06/2014 Document Reviewed: 01/11/2011 Elsevier Interactive Patient Education [employer].

## 2016-03-19 LAB — PAP IG W/ RFLX HPV ASCU: PAP SMEAR COMMENT: 0

## 2016-03-24 LAB — NUSWAB VG+, CANDIDA 6SP
Atopobium vaginae: HIGH Score — AB
CANDIDA ALBICANS, NAA: NEGATIVE
CANDIDA GLABRATA, NAA: NEGATIVE
CANDIDA LUSITANIAE, NAA: NEGATIVE
CANDIDA PARAPSILOSIS, NAA: NEGATIVE
Candida krusei, NAA: NEGATIVE
Candida tropicalis, NAA: NEGATIVE
Chlamydia trachomatis, NAA: NEGATIVE
Neisseria gonorrhoeae, NAA: NEGATIVE
Trich vag by NAA: NEGATIVE

## 2016-03-24 MED ORDER — METRONIDAZOLE 500 MG PO TABS: 500.0000 mg | ORAL_TABLET | Freq: Two times a day (BID) | ORAL | 0 refills | Status: AC

## 2016-03-24 NOTE — Addendum Note (Signed)
Addended by: Catalina Antigua on: 03/24/2016 05:35 PM   Modules accepted: Orders

## 2016-03-30 ENCOUNTER — Encounter: Payer: Self-pay | Admitting: Obstetrics & Gynecology

## 2016-03-31 NOTE — Telephone Encounter (Signed)
Called pt and left message that medication had been sent to the pharmacy.

## 2016-03-31 NOTE — Telephone Encounter (Signed)
-----   Message from Catalina Antigua, MD sent at 03/24/2016  5:35 PM EDT ----- Please inform patient of BV infection. Rx e-prescribed  Thanks  [employer]

## 2016-05-02 ENCOUNTER — Emergency Department (HOSPITAL_COMMUNITY): Payer: 59

## 2016-05-02 ENCOUNTER — Emergency Department (HOSPITAL_COMMUNITY)
Admission: EM | Admit: 2016-05-02 | Discharge: 2016-05-02 | Disposition: A | Discharge status: Home / Self Care | Payer: 59 | Attending: Emergency Medicine | Admitting: Emergency Medicine

## 2016-05-02 ENCOUNTER — Encounter (HOSPITAL_COMMUNITY): Payer: Self-pay

## 2016-05-02 DIAGNOSIS — R072 Precordial pain: Secondary | ICD-10-CM | POA: Insufficient documentation

## 2016-05-02 DIAGNOSIS — R079 Chest pain, unspecified: Secondary | ICD-10-CM | POA: Diagnosis not present

## 2016-05-02 DIAGNOSIS — Z87891 Personal history of nicotine dependence: Secondary | ICD-10-CM | POA: Insufficient documentation

## 2016-05-02 LAB — CBC
HEMATOCRIT: 35.3 % — AB (ref 36.0–46.0)
Hemoglobin: 11.7 g/dL — ABNORMAL LOW (ref 12.0–15.0)
MCH: 32.7 pg (ref 26.0–34.0)
MCHC: 33.1 g/dL (ref 30.0–36.0)
MCV: 98.6 fL (ref 78.0–100.0)
PLATELETS: 280 10*3/uL (ref 150–400)
RBC: 3.58 MIL/uL — ABNORMAL LOW (ref 3.87–5.11)
RDW: 11.9 % (ref 11.5–15.5)
WBC: 10.4 10*3/uL (ref 4.0–10.5)

## 2016-05-02 LAB — BASIC METABOLIC PANEL
Anion gap: 7 (ref 5–15)
BUN: 8 mg/dL (ref 6–20)
CHLORIDE: 104 mmol/L (ref 101–111)
CO2: 25 mmol/L (ref 22–32)
CREATININE: 0.77 mg/dL (ref 0.44–1.00)
Calcium: 8.9 mg/dL (ref 8.9–10.3)
GFR calc Af Amer: >60 mL/min (ref >60)
GFR calc non Af Amer: >60 mL/min (ref >60)
GLUCOSE: 98 mg/dL (ref 65–99)
POTASSIUM: 3.3 mmol/L — AB (ref 3.5–5.1)
SODIUM: 136 mmol/L (ref 135–145)

## 2016-05-02 LAB — I-STAT TROPONIN, ED: Troponin i, poc: 0.01 ng/mL (ref 0.00–0.08)

## 2016-05-02 NOTE — ED Triage Notes (Signed)
Pt states that for the past three days, she has been having CP and palpations with some dizziness. Cp is central with radiation to L shoulder. Denies n/v/sob.

## 2016-05-02 NOTE — Discharge Instructions (Signed)
Please read and follow all provided instructions.  Your diagnoses today include:  1. Chest pain, unspecified chest pain type     Tests performed today include: An EKG of your heart A chest x-ray Cardiac enzymes - a blood test for heart muscle damage Blood counts and electrolytes Vital signs. See below for your results today.   Medications prescribed:   Take any prescribed medications only as directed.  Follow-up instructions: Please follow-up with your primary care provider as soon as you can for further evaluation of your symptoms.   Return instructions:  SEEK IMMEDIATE MEDICAL ATTENTION IF: You have severe chest pain, especially if the pain is crushing or pressure-like and spreads to the arms, back, neck, or jaw, or if you have sweating, nausea (feeling sick to your stomach), or shortness of breath. THIS IS AN EMERGENCY. Don't wait to see if the pain will go away. Get medical help at once. Call 911 or 0 (operator). DO NOT drive yourself to the hospital.  Your chest pain gets worse and does not go away with rest.  You have an attack of chest pain lasting longer than usual, despite rest and treatment with the medications your caregiver has prescribed.  You wake from sleep with chest pain or shortness of breath. You feel dizzy or faint. You have chest pain not typical of your usual pain for which you originally saw your caregiver.  You have any other emergent concerns regarding your health.  Additional Information: Chest pain comes from many different causes. Your caregiver has diagnosed you as having chest pain that is not specific for one problem, but does not require admission.  You are at low risk for an acute heart condition or other serious illness.   Your vital signs today were: BP (!) 94/52    Pulse (!) 54    Temp 98.2 F (36.8 C) (Oral)    Resp 25    Ht 5\' 2"  (1.575 m)    Wt 66.2 kg    LMP 04/04/2016    SpO2 96%    BMI 26.70 kg/m  If your blood pressure (BP) was elevated  above 135/85 this visit, please have this repeated by your doctor within one month. --------------

## 2016-05-02 NOTE — ED Notes (Signed)
Patient transported to X-ray 

## 2016-05-02 NOTE — ED Provider Notes (Signed)
[facility]-EMERGENCY DEPT Provider Note   CSN: [id number] Arrival date & time: 05/02/16  0006     History   Chief Complaint Chief Complaint  Patient presents with  . Chest Pain    HPI Debra Boyd is a 25 y.o. female.  HPI  25 y.o. female, presents to the Emergency Department today complaining of palpitations with onset around 2pm, but has been having these sensations x 3 days. Pt states that she was visiting her husband in prison when the episode occurred. Notes similar in the past x 1 year ago and LWBS after symptoms resolved. Pt states that she also had a sharp chest pain sensation mid sternal that lasted for a few seconds. No N/V. No diaphoresis. Pt states palpitations have been intermittently occurring. No headaches. No numbness/tingling. No fevers. Notes FH MI with grandmother. No HTN, DM, HLD, no smoking. No hx DVT/PE. No recent surgeries. No exogenous estrogen use. No recent long distance travel. No other symptoms noted.   Past Medical History:  Diagnosis Date  . Chlamydia   . Depression   . Genital HSV   . Gonorrhea   . Pelvic inflammatory disease   . Trichomonas     There are no active problems to display for this patient.   Past Surgical History:  Procedure Laterality Date  . CESAREAN SECTION      OB History    Gravida Para Term Preterm AB Living   1 1 1     1    SAB TAB Ectopic Multiple Live Births           1       Home Medications    Prior to Admission medications   Medication Sig Start Date End Date Taking? Authorizing Provider  ibuprofen (ADVIL,MOTRIN) 600 MG tablet Take 1 tablet (600 mg total) by mouth every 6 (six) hours as needed. Patient not taking: Reported on 02/06/2016 12/07/15   Aviva Signs, CNM  polyethylene glycol [facility]) packet Take 17 g by mouth daily. Patient not taking: Reported on 02/06/2016 12/07/15   Aviva Signs, CNM  valACYclovir (VALTREX) 1000 MG tablet Take 1,000 mg by mouth daily.    Historical Provider, MD    Family  History Family History  Problem Relation Age of Onset  . Diabetes Maternal Aunt   . Kidney disease Paternal Aunt   . Heart disease Maternal Grandmother   . Hypertension Maternal Grandmother   . Hyperlipidemia Maternal Grandmother   . Lupus Maternal Aunt     Social History Social History  Substance Use Topics  . Smoking status: Former Smoker    Packs/day: 0.50    Types: Cigarettes  . Smokeless tobacco: Never Used  . Alcohol use No     Allergies   Review of patient's allergies indicates no known allergies.   Review of Systems Review of Systems ROS reviewed and all are negative for acute change except as noted in the HPI.  Physical Exam Updated Vital Signs BP 124/77   Pulse 62   Temp 98.2 F (36.8 C) (Oral)   Resp 18   Ht 5\' 2"  (1.575 m)   Wt 66.2 kg   LMP 04/04/2016   SpO2 100%   BMI 26.70 kg/m   Physical Exam  Constitutional: She is oriented to person, place, and time. Vital signs are normal. She appears well-developed and well-nourished.  HENT:  Head: Normocephalic and atraumatic.  Eyes: Conjunctivae, EOM and lids are normal. Pupils are equal, round, and reactive to light.  Neck: Trachea  normal and normal range of motion. Neck supple.  Cardiovascular: Normal rate, regular rhythm, S1 normal, S2 normal, normal heart sounds, intact distal pulses and normal pulses.   Pulmonary/Chest: Effort normal and breath sounds normal.  Abdominal: Soft. Normal appearance and bowel sounds are normal. There is no tenderness.  Neurological: She is alert and oriented to person, place, and time.  Skin: Skin is warm and dry.  Psychiatric: She has a normal mood and affect. Her speech is normal and behavior is normal. Thought content normal.  Vitals reviewed.  ED Treatments / Results  Labs (all labs ordered are listed, but only abnormal results are displayed) Labs Reviewed  BASIC METABOLIC PANEL - Abnormal; Notable for the following:       Result Value   Potassium 3.3 (*)     All other components within normal limits  CBC - Abnormal; Notable for the following:    RBC 3.58 (*)    Hemoglobin 11.7 (*)    HCT 35.3 (*)    All other components within normal limits  I-STAT TROPOININ, ED    EKG  EKG Interpretation  Date/Time:  Sunday May 02 2016 00:13:41 EDT Ventricular Rate:  71 PR Interval:  164 QRS Duration: 72 QT Interval:  384 QTC Calculation: 417 R Axis:   69 Text Interpretation:  Normal sinus rhythm with sinus arrhythmia Septal infarct , age undetermined Abnormal ECG Confirmed by DELO  MD, DOUGLAS ([id number]) on 05/02/2016 1:37:41 AM      Radiology Dg Chest 2 View  Result Date: 05/02/2016 CLINICAL DATA:  Chest pain, palpitations, and dizziness for 3 days. Chest pain radiates to the left shoulder EXAM: CHEST  2 VIEW COMPARISON:  01/14/2014 FINDINGS: The heart size and mediastinal contours are within normal limits. Both lungs are clear. The visualized skeletal structures are unremarkable. IMPRESSION: No active cardiopulmonary disease. Electronically Signed   By: Burman NievesWilliam  Stevens M.D.   On: 05/02/2016 01:08    Procedures Procedures (including critical care time)  Medications Ordered in ED Medications - No data to display   Initial Impression / Assessment and Plan / ED Course  I have reviewed the triage vital signs and the nursing notes.  Pertinent labs & imaging results that were available during my care of the patient were reviewed by me and considered in my medical decision making (see chart for details).  Clinical Course    Final Clinical Impressions(s) / ED Diagnoses  I have reviewed and evaluated the relevant laboratory valuesI have reviewed and evaluated the relevant imaging studies. I have interpreted the relevant EKG.I have reviewed the relevant previous healthcare records.I obtained HPI from historian.  ED Course:  Assessment: Pt is a 24yF presents with palpitations x 3 days. Risk Factors none. No pain currently. Patient is to be  discharged with recommendation to follow up with PCP in regards to today's hospital visit. Chest pain is not likely of cardiac or pulmonary etiology d/t presentation, perc negative, VSS, no tracheal deviation, no JVD or new murmur, RRR, breath sounds equal bilaterally, EKG without acute abnormalities, negative troponin, and negative CXR. Heart Score 0. Pt has been advised to return to the ED is CP becomes exertional, associated with diaphoresis or nausea, radiates to left jaw/arm, worsens or becomes concerning in any way. Recommended Holter monitoring for palpitations with PCP. Pt appears reliable for follow up and is agreeable to discharge. Patient is in no acute distress. Vital Signs are stable. Patient is able to ambulate. Patient able to tolerate PO.   Disposition/Plan:  DC Home Additional Verbal discharge instructions given and discussed with patient.  Pt Instructed to f/u with PCP in the next week for evaluation and treatment of symptoms. Return precautions given Pt acknowledges and agrees with plan  Supervising Physician Geoffery Lyons, MD   Final diagnoses:  Chest pain, unspecified chest pain type    New Prescriptions New Prescriptions   No medications on file     Audry Pili, PA-C 05/02/16 [id number]    Geoffery Lyons, MD 05/02/16 [phone]

## 2016-05-28 LAB — PROCEDURE REPORT - SCANNED: PAP SMEAR: NEGATIVE

## 2016-08-18 DIAGNOSIS — B009 Herpesviral infection, unspecified: Secondary | ICD-10-CM | POA: Diagnosis not present

## 2016-08-18 DIAGNOSIS — G43909 Migraine, unspecified, not intractable, without status migrainosus: Secondary | ICD-10-CM | POA: Diagnosis not present

## 2016-08-18 DIAGNOSIS — Z0000 Encounter for general adult medical examination without abnormal findings: Secondary | ICD-10-CM | POA: Diagnosis not present

## 2016-08-18 DIAGNOSIS — L659 Nonscarring hair loss, unspecified: Secondary | ICD-10-CM | POA: Diagnosis not present

## 2016-11-22 ENCOUNTER — Ambulatory Visit (HOSPITAL_COMMUNITY)
Admission: EM | Admit: 2016-11-22 | Discharge: 2016-11-22 | Disposition: A | Discharge status: Home / Self Care | Payer: 59 | Attending: Internal Medicine | Admitting: Internal Medicine

## 2016-11-22 ENCOUNTER — Encounter (HOSPITAL_COMMUNITY): Payer: Self-pay | Admitting: Emergency Medicine

## 2016-11-22 DIAGNOSIS — Z79899 Other long term (current) drug therapy: Secondary | ICD-10-CM | POA: Insufficient documentation

## 2016-11-22 DIAGNOSIS — N898 Other specified noninflammatory disorders of vagina: Secondary | ICD-10-CM

## 2016-11-22 DIAGNOSIS — Z87891 Personal history of nicotine dependence: Secondary | ICD-10-CM | POA: Diagnosis not present

## 2016-11-22 DIAGNOSIS — R110 Nausea: Secondary | ICD-10-CM

## 2016-11-22 DIAGNOSIS — Z3202 Encounter for pregnancy test, result negative: Secondary | ICD-10-CM | POA: Diagnosis not present

## 2016-11-22 DIAGNOSIS — B9689 Other specified bacterial agents as the cause of diseases classified elsewhere: Secondary | ICD-10-CM

## 2016-11-22 DIAGNOSIS — N760 Acute vaginitis: Secondary | ICD-10-CM | POA: Diagnosis not present

## 2016-11-22 LAB — POCT URINALYSIS DIP (DEVICE)
Bilirubin Urine: NEGATIVE
Glucose, UA: NEGATIVE mg/dL
Hgb urine dipstick: NEGATIVE
Ketones, ur: NEGATIVE mg/dL
Nitrite: NEGATIVE
Protein, ur: NEGATIVE mg/dL
Specific Gravity, Urine: 1.015 (ref 1.005–1.030)
Urobilinogen, UA: 0.2 mg/dL (ref 0.0–1.0)
pH: 7 (ref 5.0–8.0)

## 2016-11-22 LAB — POCT PREGNANCY, URINE: Preg Test, Ur: NEGATIVE

## 2016-11-22 MED ORDER — METRONIDAZOLE 500 MG PO TABS
500.0000 mg | ORAL_TABLET | Freq: Two times a day (BID) | ORAL | 0 refills | Status: DC
Start: 2016-11-22 — End: 2017-04-13

## 2016-11-22 MED ORDER — ONDANSETRON 4 MG PO TBDP: 4.0000 mg | ORAL_TABLET | Freq: Three times a day (TID) | ORAL | 0 refills | Status: DC | PRN

## 2016-11-22 MED ORDER — FLUCONAZOLE 200 MG PO TABS: ORAL_TABLET | ORAL | 0 refills | Status: DC

## 2016-11-22 NOTE — Discharge Instructions (Signed)
I am treating you for bacterial vaginosis. To treat BV, I have prescribed metronidazole. Take 1 tablet twice a day for 7 days. Do not drink any alcohol while taking this medicine as it can make you very ill. To cover for possible yeast, I have prescribed Diflucan, take 1 tablet today, wait three days, then take the second pill. For Nausea, I have prescribed Zofran, take 1 tablet under the tongue every 8 hours as needed.

## 2016-11-22 NOTE — ED Triage Notes (Signed)
The [facility] with a complaint of vaginal itching, burning and a discharge x 5 days.

## 2016-11-22 NOTE — ED Provider Notes (Signed)
CSN: [id number]     Arrival date & time 11/22/16  1013 History   None    Chief Complaint  Patient presents with  . Vaginal Itching  . Vaginal Discharge   (Consider location/radiation/quality/duration/timing/severity/associated sxs/prior Treatment) 26 year old female presents to clinic with a chief complaint of vaginal discharge ongoing for several days. She states she has had past diagnoses of bacterial vaginosis in the past, in that her symptoms are consistent with prior diagnoses. She is requesting pregnancy testing, and treatment for BV, but is requesting to defer a pelvic exam at this time. She is also experienced some nausea, but no fever, vomiting, fatigue, or other systemic symptoms.   The history is provided by the patient.  Vaginal Discharge  Quality:  Watery and white Severity:  Moderate Onset quality:  Gradual Duration:  4 days Timing:  Intermittent Progression:  Waxing and waning Chronicity:  New Context: after urination and at rest   Context: not genital trauma and not recent antibiotic use   Relieved by:  Nothing Worsened by:  Nothing Ineffective treatments:  None tried Associated symptoms: nausea and vaginal itching   Associated symptoms: no abdominal pain, no dyspareunia, no dysuria, no fever, no genital lesions, no rash, no urinary frequency, no urinary hesitancy, no urinary incontinence and no vomiting   Nausea:    Severity:  Mild   Onset quality:  Gradual   Duration:  1 week   Timing:  Intermittent   Progression:  Waxing and waning Risk factors: STI (previous diagnosis )   Risk factors: no gynecological surgery, no new sexual partner and no PID     Past Medical History:  Diagnosis Date  . Chlamydia   . Depression   . Genital HSV   . Gonorrhea   . Pelvic inflammatory disease   . Trichomonas    Past Surgical History:  Procedure Laterality Date  . CESAREAN SECTION     Family History  Problem Relation Age of Onset  . Diabetes Maternal Aunt   .  Kidney disease Paternal Aunt   . Heart disease Maternal Grandmother   . Hypertension Maternal Grandmother   . Hyperlipidemia Maternal Grandmother   . Lupus Maternal Aunt    Social History  Substance Use Topics  . Smoking status: Former Smoker    Packs/day: 0.50    Types: Cigarettes  . Smokeless tobacco: Never Used  . Alcohol use No   OB History    Gravida Para Term Preterm AB Living   1 1 1     1    SAB TAB Ectopic Multiple Live Births           1     Review of Systems  Reason unable to perform ROS: as covered in HPI.  Constitutional: Negative for fever.  Gastrointestinal: Positive for nausea. Negative for abdominal pain and vomiting.  Genitourinary: Positive for vaginal discharge. Negative for bladder incontinence, dyspareunia, dysuria and hesitancy.  All other systems reviewed and are negative.   Allergies  Patient has no known allergies.  Home Medications   Prior to Admission medications   Medication Sig Start Date End Date Taking? Authorizing Provider  acyclovir (ZOVIRAX) 400 MG tablet Take 400 mg by mouth 5 (five) times daily.   Yes Historical Provider, MD  fluconazole (DIFLUCAN) 200 MG tablet Take 1 tablet now, waite three days and then take the second 11/22/16   Dorena BodoLawrence Penelope Fittro, NP  metroNIDAZOLE (FLAGYL) 500 MG tablet Take 1 tablet (500 mg total) by mouth 2 (two) times daily. 11/22/16  Dorena Bodo, NP  ondansetron (ZOFRAN ODT) 4 MG disintegrating tablet Take 1 tablet (4 mg total) by mouth every 8 (eight) hours as needed for nausea or vomiting. 11/22/16   Dorena Bodo, NP   Meds Ordered and Administered this Visit  Medications - No data to display  BP 116/60 (BP Location: Right Arm)   Pulse (!) 54   Temp 98.6 F (37 C) (Oral)   Resp 18   LMP 10/29/2016 (Exact Date)   SpO2 99%  No data found.   Physical Exam  Constitutional: She is oriented to person, place, and time. She appears well-developed and well-nourished. No distress.  HENT:  Head:  Normocephalic and atraumatic.  Cardiovascular: Normal rate and regular rhythm.   Pulmonary/Chest: Effort normal and breath sounds normal.  Abdominal: Soft. Bowel sounds are normal. She exhibits no distension. There is no tenderness. There is no guarding.  Genitourinary:  Genitourinary Comments: Deferred at pt request, urine cytology collected  Musculoskeletal: She exhibits no edema or tenderness.  Neurological: She is alert and oriented to person, place, and time.  Skin: Skin is warm and dry. Capillary refill takes less than 2 seconds. She is not diaphoretic.  Psychiatric: She has a normal mood and affect. Her behavior is normal.  Nursing note and vitals reviewed.   Urgent Care Course     Procedures (including critical care time)  Labs Review Labs Reviewed  POCT URINALYSIS DIP (DEVICE) - Abnormal; Notable for the following:       Result Value   Leukocytes, UA TRACE (*)    All other components within normal limits  POCT PREGNANCY, URINE  URINE CYTOLOGY ANCILLARY ONLY    Imaging Review No results found.     MDM   1. BV (bacterial vaginosis)   2. Nausea    Treating for bacterial vaginosis. Prescription for metronidazole sent to the patient's pharmacy. Also given Zofran for symptomatic relief. Urine cytology collected tested for gonorrhea, chlamydia, trichomoniasis, Gardnerella, candidiasis. Patient also given Diflucan to cover for possible yeast. Recommend follow up with primary care in 1 week if symptoms persist.     Dorena Bodo, NP 11/22/16 1237

## 2016-11-23 LAB — URINE CYTOLOGY ANCILLARY ONLY
Chlamydia: NEGATIVE
Neisseria Gonorrhea: NEGATIVE
Trichomonas: NEGATIVE

## 2016-11-25 LAB — URINE CYTOLOGY ANCILLARY ONLY
Bacterial vaginitis: NEGATIVE
Candida vaginitis: NEGATIVE

## 2016-12-01 DIAGNOSIS — N949 Unspecified condition associated with female genital organs and menstrual cycle: Secondary | ICD-10-CM | POA: Diagnosis not present

## 2016-12-01 DIAGNOSIS — B009 Herpesviral infection, unspecified: Secondary | ICD-10-CM | POA: Diagnosis not present

## 2017-03-26 ENCOUNTER — Encounter (HOSPITAL_COMMUNITY): Payer: Self-pay

## 2017-03-26 ENCOUNTER — Emergency Department (HOSPITAL_COMMUNITY)
Admission: EM | Admit: 2017-03-26 | Discharge: 2017-03-26 | Disposition: A | Discharge status: Home / Self Care | Payer: 59 | Attending: Emergency Medicine | Admitting: Emergency Medicine

## 2017-03-26 DIAGNOSIS — S0993XA Unspecified injury of face, initial encounter: Secondary | ICD-10-CM | POA: Diagnosis present

## 2017-03-26 DIAGNOSIS — Y999 Unspecified external cause status: Secondary | ICD-10-CM | POA: Insufficient documentation

## 2017-03-26 DIAGNOSIS — S0181XA Laceration without foreign body of other part of head, initial encounter: Secondary | ICD-10-CM | POA: Insufficient documentation

## 2017-03-26 DIAGNOSIS — Y289XXA Contact with unspecified sharp object, undetermined intent, initial encounter: Secondary | ICD-10-CM | POA: Diagnosis not present

## 2017-03-26 DIAGNOSIS — Y929 Unspecified place or not applicable: Secondary | ICD-10-CM | POA: Insufficient documentation

## 2017-03-26 DIAGNOSIS — Y939 Activity, unspecified: Secondary | ICD-10-CM | POA: Diagnosis not present

## 2017-03-26 DIAGNOSIS — M542 Cervicalgia: Secondary | ICD-10-CM | POA: Diagnosis not present

## 2017-03-26 DIAGNOSIS — R51 Headache: Secondary | ICD-10-CM | POA: Diagnosis not present

## 2017-03-26 DIAGNOSIS — Z79899 Other long term (current) drug therapy: Secondary | ICD-10-CM | POA: Insufficient documentation

## 2017-03-26 DIAGNOSIS — Z87891 Personal history of nicotine dependence: Secondary | ICD-10-CM | POA: Diagnosis not present

## 2017-03-26 MED ORDER — LIDOCAINE-EPINEPHRINE (PF) 2 %-1:200000 IJ SOLN
20.0000 mL | Freq: Once | INTRAMUSCULAR | Status: AC
  Administered 2017-03-26: 20 mL via INTRADERMAL
  Filled 2017-03-26: qty 20

## 2017-03-26 NOTE — ED Triage Notes (Signed)
Per Pt, Pt states "I don't want to talk about what happened, I just need stitches." Pt has a laceration noted to her upper left forehead. Denies LOC

## 2017-03-26 NOTE — ED Provider Notes (Signed)
[facility] Provider Note   CSN: [id number] Arrival date & time: 03/26/17  1157   By signing my name below, I, Clarisse Gouge, attest that this documentation has been prepared under the direction and in the presence of Terance Hart, PA-C. Electronically signed, Clarisse Gouge, ED Scribe. 03/26/17. 12:20 PM.   History   Chief Complaint Chief Complaint  Patient presents with  . Facial Laceration   LEVEL 5 CAVEAT: HPI and ROS limited due to pt declining to provide hisory   The history is provided by the patient and medical records. History limited by: pt refuses to provide history. No language interpreter was used.    Debra Boyd is a 26 y.o. female with h/o depression presenting to the Emergency Department concerning a laceration to the L upper forehead. Associated headache and mild neck pain. Bleeding controlled on evaluation with gauze. Pt states she feels safe at home and she would not like to speak with police. No LOC, vision loss or any other complaints noted at this time.   LEVEL 5 CAVEAT due to pt's unwillingness to give history  Past Medical History:  Diagnosis Date  . Chlamydia   . Depression   . Genital HSV   . Gonorrhea   . Pelvic inflammatory disease   . Trichomonas     There are no active problems to display for this patient.   Past Surgical History:  Procedure Laterality Date  . CESAREAN SECTION      OB History    Gravida Para Term Preterm AB Living   1 1 1     1    SAB TAB Ectopic Multiple Live Births           1       Home Medications    Prior to Admission medications   Medication Sig Start Date End Date Taking? Authorizing Provider  acyclovir (ZOVIRAX) 400 MG tablet Take 400 mg by mouth 5 (five) times daily.    [provider]  fluconazole (DIFLUCAN) 200 MG tablet Take 1 tablet now, waite three days and then take the second 11/22/16   Dorena Bodo, NP  metroNIDAZOLE (FLAGYL) 500 MG tablet Take 1 tablet (500 mg total) by mouth  2 (two) times daily. 11/22/16   Dorena Bodo, NP  ondansetron (ZOFRAN ODT) 4 MG disintegrating tablet Take 1 tablet (4 mg total) by mouth every 8 (eight) hours as needed for nausea or vomiting. 11/22/16   Dorena Bodo, NP    Family History Family History  Problem Relation Age of Onset  . Diabetes Maternal Aunt   . Kidney disease Paternal Aunt   . Heart disease Maternal Grandmother   . Hypertension Maternal Grandmother   . Hyperlipidemia Maternal Grandmother   . Lupus Maternal Aunt     Social History Social History  Substance Use Topics  . Smoking status: Former Smoker    Packs/day: 0.50    Types: Cigarettes  . Smokeless tobacco: Never Used  . Alcohol use Yes     Allergies   Patient has no known allergies.   Review of Systems Review of Systems  Unable to perform ROS: Other    Physical Exam Updated Vital Signs BP 133/82 (BP Location: Right Arm)   Pulse (!) 108   Temp 99.3 F (37.4 C) (Oral)   Resp 16   Ht 5\' 2"  (1.575 m)   Wt 144 lb (65.3 kg)   LMP 02/28/2017 (Within Days)   SpO2 99%   BMI 26.34 kg/m   Physical Exam  Constitutional: She is oriented to person, place, and time. She appears well-developed and well-nourished. No distress.  HENT:  Head: Normocephalic.  3 cm laceration to upper L forehead which is gaping. Bleeding controlled.  Eyes: Pupils are equal, round, and reactive to light. Conjunctivae and EOM are normal. Right eye exhibits no discharge. Left eye exhibits no discharge. No scleral icterus.  Neck: Normal range of motion.  Cardiovascular: Normal rate.   Pulmonary/Chest: Effort normal. No respiratory distress.  Abdominal: She exhibits no distension.  Musculoskeletal: Normal range of motion.  Neurological: She is alert and oriented to person, place, and time.  Skin: Skin is warm and dry.  Psychiatric: Her affect is blunt. She is withdrawn.  Nursing note and vitals reviewed.    ED Treatments / Results  DIAGNOSTIC STUDIES: Oxygen  Saturation is 99% on RA, NL by my interpretation.    COORDINATION OF CARE: 12:18 PM-Discussed next steps with pt. Pt verbalized understanding and is agreeable with the plan. Pt prepared for laceration repair.   Labs (all labs ordered are listed, but only abnormal results are displayed) Labs Reviewed - No data to display  EKG  EKG Interpretation None       Radiology No results found.  Procedures .Marland Kitchen.Laceration Repair Date/Time: 03/26/2017 12:27 PM Performed by: Terance HartGEKAS, Jenavieve Freda MARIE Authorized by: Terance HartGEKAS, Weyman Bogdon MARIE   Consent:    Consent obtained:  Verbal   Consent given by:  Patient Anesthesia (see MAR for exact dosages):    Anesthesia method:  None Laceration details:    Location:  Face   Face location:  Forehead   Length (cm):  3 Repair type:    Repair type:  Simple Pre-procedure details:    Preparation:  Patient was prepped and draped in usual sterile fashion Exploration:    Hemostasis achieved with:  Direct pressure   Contaminated: no   Treatment:    Wound cleansed with: SAF-Cleanse.   Amount of cleaning:  Standard   Irrigation solution:  Sterile water   Irrigation method:  Syringe   Visualized foreign bodies/material removed: no   Skin repair:    Repair method:  Tissue adhesive and sutures   Suture size:  6-0   Suture material:  Prolene   Suture technique:  Simple interrupted   Number of sutures:  5 Approximation:    Approximation:  Close   Vermilion border: well-aligned   Post-procedure details:    Dressing:  Sterile dressing   Patient tolerance of procedure:  Tolerated well, no immediate complications    (including critical care time)  Medications Ordered in ED Medications  lidocaine-EPINEPHrine (XYLOCAINE W/EPI) 2 %-1:200000 (PF) injection 20 mL (20 mLs Intradermal Given 03/26/17 1223)     Initial Impression / Assessment and Plan / ED Course  I have reviewed the triage vital signs and the nursing notes.  Pertinent labs & imaging results that  were available during my care of the patient were reviewed by me and considered in my medical decision making (see chart for details).  26 year old with forehead laceration. It was repaired and irrigated in the ED. Bottom of the wound visualized and bleeding controlled. 5 sutures placed. Wound care discussed and advised to return to have stitches removed in 5 days. Tetanus is UTD per pt. She is refusing to provide details of incident. She does not want to talk to police and says she feels safe at home. Return precautions discussed.   Final Clinical Impressions(s) / ED Diagnoses   Final diagnoses:  Laceration of  forehead, initial encounter    New Prescriptions New Prescriptions   No medications on file   I personally performed the services described in this documentation, which was scribed in my presence. The recorded information has been reviewed and is accurate.    Bethel BornGekas, Ifeanyichukwu Wickham Marie, PA-C 03/26/17 1726    Margarita Grizzleay, Danielle, MD 03/27/17 [phone]

## 2017-03-26 NOTE — Discharge Instructions (Signed)
Keep area clean and dry. You can wash with soap and water Change bandage at least once daily, more if it is dirty Use ointment and sunscreen on area to reduce appearance of scar Have stitches removed in 5 days

## 2017-04-13 ENCOUNTER — Encounter (HOSPITAL_COMMUNITY): Payer: Self-pay | Admitting: Emergency Medicine

## 2017-04-13 ENCOUNTER — Ambulatory Visit (HOSPITAL_COMMUNITY)
Admission: EM | Admit: 2017-04-13 | Discharge: 2017-04-13 | Disposition: A | Discharge status: Home / Self Care | Payer: 59 | Attending: Family Medicine | Admitting: Family Medicine

## 2017-04-13 DIAGNOSIS — N898 Other specified noninflammatory disorders of vagina: Secondary | ICD-10-CM | POA: Diagnosis not present

## 2017-04-13 LAB — POCT PREGNANCY, URINE: PREG TEST UR: NEGATIVE

## 2017-04-13 MED ORDER — AZITHROMYCIN 250 MG PO TABS
ORAL_TABLET | ORAL | Status: AC
  Filled 2017-04-13: qty 4

## 2017-04-13 MED ORDER — CEFTRIAXONE SODIUM 250 MG IJ SOLR
250.0000 mg | Freq: Once | INTRAMUSCULAR | Status: AC
  Administered 2017-04-13: 250 mg via INTRAMUSCULAR

## 2017-04-13 MED ORDER — AZITHROMYCIN 250 MG PO TABS
1000.0000 mg | ORAL_TABLET | Freq: Once | ORAL | Status: AC
  Administered 2017-04-13: 1000 mg via ORAL

## 2017-04-13 MED ORDER — CEFTRIAXONE SODIUM 250 MG IJ SOLR
INTRAMUSCULAR | Status: AC
  Filled 2017-04-13: qty 250

## 2017-04-13 MED ORDER — METRONIDAZOLE 500 MG PO TABS: 500.0000 mg | ORAL_TABLET | Freq: Two times a day (BID) | ORAL | 0 refills | Status: DC

## 2017-04-13 NOTE — ED Triage Notes (Signed)
[facility] a [facility] of an STD exposure and vaginal discharge x 3 days.

## 2017-04-13 NOTE — ED Provider Notes (Signed)
  [facility]   [id number] 04/13/17 Arrival Time: 1248  ASSESSMENT & PLAN:  1. Vaginal discharge    Meds ordered this encounter  Medications  . cefTRIAXone (ROCEPHIN) injection 250 mg    Order Specific Question:   Antibiotic Indication:    Answer:   STD  . azithromycin (ZITHROMAX) tablet 1,000 mg  . metroNIDAZOLE (FLAGYL) 500 MG tablet    Sig: Take 1 tablet (500 mg total) by mouth 2 (two) times daily.    Dispense:  14 tablet    Refill:  0   Requests empiric treatment for BV. If not able to see her regular doctor she will f/u here if not improving or feels that symptoms are worsening. Will contact her when urine cytology and HIV testing results are available. Reports her partner is here today to be seen and treated also.  Reviewed expectations re: course of current medical issues. Questions answered. Outlined signs and symptoms indicating need for more acute intervention. Patient verbalized understanding. After Visit Summary given.   SUBJECTIVE:  Debra Boyd is a 26 y.o. female who presents with complaint of vaginal discharge for 2-3 days. New female sexual partner without condom use. She is concerned about possible STI. H/O in the past with reported PID. Afebrile. No abdominal or pelvic pain. No vaginal bleeding. LMP approx 2 weeks ago. No n/v. Describes vaginal discharge as thick and brown. Mild odor. No vaginal itching or external irritation/rash.   ROS: As per HPI.   OBJECTIVE:  Vitals:   04/13/17 1354  BP: 111/68  Pulse: 65  Resp: 16  Temp: 98.7 F (37.1 C)  TempSrc: Oral  SpO2: 100%     General appearance: alert, cooperative, appears stated age and no distress Throat: lips, mucosa, and tongue normal; teeth and gums normal Back: no CVA tenderness Abdomen: soft, non-tender; bowel sounds normal; no masses or organomegaly; no guarding or rebound tenderness GU: declines Skin: warm and dry Psychological:  Alert and cooperative. Normal mood and  affect.  Results for orders placed or performed during the hospital encounter of 04/13/17  Pregnancy, urine POC  Result Value Ref Range   Preg Test, Ur NEGATIVE NEGATIVE    Labs Reviewed  HIV ANTIBODY (ROUTINE TESTING)  URINE CYTOLOGY ANCILLARY ONLY    No Known Allergies  PMHx, SurgHx, SocialHx, Medications, and Allergies were reviewed in the Visit Navigator and updated as appropriate.       Mardella LaymanHagler, Meena Barrantes, MD 04/13/17 [phone]

## 2017-04-13 NOTE — Discharge Instructions (Signed)
You have been given the following medications today for treatment of suspected gonorrhea and/or chlamydia:  cefTRIAXone (ROCEPHIN) injection 250 mg azithromycin (ZITHROMAX) tablet 1,000 mg  Even though we have treated you today, we have sent testing for sexually transmitted infections. We will notify you of the results once they are received. If needed we will prescribe any medications needed. We recommend no sexual intercourse for at least seven days.

## 2017-04-14 LAB — URINE CYTOLOGY ANCILLARY ONLY
CHLAMYDIA, DNA PROBE: NEGATIVE
Neisseria Gonorrhea: POSITIVE — AB
Trichomonas: POSITIVE — AB

## 2017-04-14 LAB — HIV ANTIBODY (ROUTINE TESTING W REFLEX): HIV SCREEN 4TH GENERATION: NONREACTIVE

## 2017-04-18 LAB — URINE CYTOLOGY ANCILLARY ONLY: CANDIDA VAGINITIS: NEGATIVE

## 2017-05-20 ENCOUNTER — Inpatient Hospital Stay (HOSPITAL_COMMUNITY)
Admission: AD | Admit: 2017-05-20 | Discharge: 2017-05-20 | Discharge status: Against Medical Advice | Payer: Self-pay | Source: Ambulatory Visit | Attending: Obstetrics and Gynecology | Admitting: Obstetrics and Gynecology

## 2017-05-20 DIAGNOSIS — Z5321 Procedure and treatment not carried out due to patient leaving prior to being seen by health care provider: Secondary | ICD-10-CM | POA: Insufficient documentation

## 2017-05-20 DIAGNOSIS — R102 Pelvic and perineal pain: Secondary | ICD-10-CM | POA: Insufficient documentation

## 2017-05-20 LAB — URINALYSIS, ROUTINE W REFLEX MICROSCOPIC
Bilirubin Urine: NEGATIVE
Glucose, UA: NEGATIVE mg/dL
Hgb urine dipstick: NEGATIVE
KETONES UR: NEGATIVE mg/dL
NITRITE: NEGATIVE
PH: 5 (ref 5.0–8.0)
Protein, ur: NEGATIVE mg/dL
Specific Gravity, Urine: 1.029 (ref 1.005–1.030)

## 2017-05-20 LAB — POCT PREGNANCY, URINE: Preg Test, Ur: NEGATIVE

## 2017-05-20 NOTE — MAU Note (Signed)
Vaginal pain started after period--LMP 8/29--white thin discharge Still having vaginal irritation and itching Treated last month for STD's

## 2017-05-20 NOTE — MAU Note (Signed)
Pt not wanting to stay. Signed AMA form Instructed she could return at anytime to be seen.

## 2017-05-21 ENCOUNTER — Encounter (HOSPITAL_COMMUNITY): Payer: Self-pay | Admitting: *Deleted

## 2017-05-21 ENCOUNTER — Inpatient Hospital Stay (HOSPITAL_COMMUNITY)
Admission: AD | Admit: 2017-05-21 | Discharge: 2017-05-21 | Disposition: A | Discharge status: Home / Self Care | Payer: Medicaid Other | Source: Ambulatory Visit | Attending: Obstetrics & Gynecology | Admitting: Obstetrics & Gynecology

## 2017-05-21 DIAGNOSIS — N730 Acute parametritis and pelvic cellulitis: Secondary | ICD-10-CM | POA: Insufficient documentation

## 2017-05-21 DIAGNOSIS — F17210 Nicotine dependence, cigarettes, uncomplicated: Secondary | ICD-10-CM | POA: Insufficient documentation

## 2017-05-21 DIAGNOSIS — R102 Pelvic and perineal pain: Secondary | ICD-10-CM

## 2017-05-21 LAB — WET PREP, GENITAL
CLUE CELLS WET PREP: NONE SEEN
Sperm: NONE SEEN
TRICH WET PREP: NONE SEEN
YEAST WET PREP: NONE SEEN

## 2017-05-21 LAB — CBC WITH DIFFERENTIAL/PLATELET
BASOS ABS: 0 10*3/uL (ref 0.0–0.1)
BASOS PCT: 0 %
EOS ABS: 0.1 10*3/uL (ref 0.0–0.7)
EOS PCT: 1 %
HCT: 35.4 % — ABNORMAL LOW (ref 36.0–46.0)
Hemoglobin: 12.3 g/dL (ref 12.0–15.0)
Lymphocytes Relative: 32 %
Lymphs Abs: 2.8 10*3/uL (ref 0.7–4.0)
MCH: 33.6 pg (ref 26.0–34.0)
MCHC: 34.7 g/dL (ref 30.0–36.0)
MCV: 96.7 fL (ref 78.0–100.0)
MONO ABS: 0.3 10*3/uL (ref 0.1–1.0)
Monocytes Relative: 3 %
NEUTROS ABS: 5.4 10*3/uL (ref 1.7–7.7)
Neutrophils Relative %: 64 %
PLATELETS: 250 10*3/uL (ref 150–400)
RBC: 3.66 MIL/uL — ABNORMAL LOW (ref 3.87–5.11)
RDW: 11.7 % (ref 11.5–15.5)
WBC: 8.6 10*3/uL (ref 4.0–10.5)

## 2017-05-21 MED ORDER — AZITHROMYCIN 250 MG PO TABS
1000.0000 mg | ORAL_TABLET | Freq: Once | ORAL | Status: AC
  Administered 2017-05-21: 1000 mg via ORAL
  Filled 2017-05-21: qty 4

## 2017-05-21 MED ORDER — CEFTRIAXONE SODIUM 250 MG IJ SOLR
250.0000 mg | INTRAMUSCULAR | Status: AC
  Administered 2017-05-21: 250 mg via INTRAMUSCULAR
  Filled 2017-05-21: qty 250

## 2017-05-21 MED ORDER — AZITHROMYCIN 250 MG PO TABS: 1000.0000 mg | ORAL_TABLET | Freq: Every day | ORAL | 0 refills | Status: DC

## 2017-05-21 NOTE — Discharge Instructions (Signed)
Pelvic Inflammatory Disease °Pelvic inflammatory disease (PID) is an infection in some or all of the female organs. PID can be in the uterus, ovaries, fallopian tubes, or the surrounding tissues that are inside the lower belly area (pelvis). PID can lead to lasting problems if it is not treated. To check for this disease, your doctor may: °· Do a physical exam. °· Do blood tests, urine tests, or a pregnancy test. °· Look at your vaginal discharge. °· Do tests to look inside the pelvis. °· Test you for other infections. ° °Follow these instructions at home: °· Take over-the-counter and prescription medicines only as told by your doctor. °· If you were prescribed an antibiotic medicine, take it as told by your doctor. Do not stop taking it even if you start to feel better. °· Do not have sex until treatment is done or as told by your doctor. °· Tell your sex partner if you have PID. Your partner may need to be treated. °· Keep all follow-up visits as told by your doctor. This is important. °· Your doctor may test you for infection again 3 months after you are treated. °Contact a doctor if: °· You have more fluid (discharge) coming from your vagina or fluid that is not normal. °· Your pain does not improve. °· You throw up (vomit). °· You have a fever. °· You cannot take your medicines. °· Your partner has a sexually transmitted disease (STD). °· You have pain when you pee (urinate). °Get help right away if: °· You have more belly (abdominal) or lower belly pain. °· You have chills. °· You are not better after 72 hours. °This information is not intended to replace advice given to you by your health care provider. Make sure you discuss any questions you have with your health care provider. °Document Released: 11/12/2008 Document Revised: 01/22/2016 Document Reviewed: 09/23/2014 °Elsevier Interactive Patient Education © 2018 [employer] ° °

## 2017-05-21 NOTE — MAU Note (Signed)
Pt reports she was treated for trich and GC last month and since she finished the antibiotics she has had vaginal pain, vaginal irritation, and yellow vaginal discharge. States last pm she began having pain and burning externally when she urinates.

## 2017-05-21 NOTE — MAU Provider Note (Signed)
History     CSN: [id number]  Arrival date and time: 05/21/17 4098   First Provider Initiated Contact with Patient 05/21/17 [phone]      Chief Complaint  Patient presents with  . Vaginal Discharge  . Vaginal Itching   HPI   Ms.Debra Boyd is a 26 y.o. female here with vaginal irritation and vaginal discharge. The discharge is yellow in color. Non odorous.  She was diagnosed and treated for gonorrhea and trichomas 1 month ago. Partner was treated as well.  States she did have "non- penetrating" intercourse within 7 days of treatment. States she has been having pelvic pain intermittently  since she was treated. Patient states that her discharge "does not have a taste"   OB History    Gravida Para Term Preterm AB Living   SAB TAB Ectopic Multiple Live Births           1      Past Medical History:  Diagnosis Date  . Chlamydia   . Depression   . Genital HSV   . Gonorrhea   . Pelvic inflammatory disease   . Trichomonas     Past Surgical History:  Procedure Laterality Date  . CESAREAN SECTION      Family History  Problem Relation Age of Onset  . Diabetes Maternal Aunt   . Kidney disease Paternal Aunt   . Heart disease Maternal Grandmother   . Hypertension Maternal Grandmother   . Hyperlipidemia Maternal Grandmother   . Lupus Maternal Aunt     Social History  Substance Use Topics  . Smoking status: Current Every Day Smoker    Packs/day: 0.50    Types: Cigarettes  . Smokeless tobacco: Never Used  . Alcohol use Yes    Allergies: No Known Allergies  Prescriptions Prior to Admission  Medication Sig Dispense Refill Last Dose  . valACYclovir (VALTREX) 1000 MG tablet Take 1,000 mg by mouth 2 (two) times daily.   05/21/2017 at Unknown time  . acyclovir (ZOVIRAX) 400 MG tablet Take 400 mg by mouth 5 (five) times daily.   11/22/2016 at Unknown time  . fluconazole (DIFLUCAN) 200 MG tablet Take 1 tablet now, waite three days and then take the second 2  tablet 0 More than a month at Unknown time  . metroNIDAZOLE (FLAGYL) 500 MG tablet Take 1 tablet (500 mg total) by mouth 2 (two) times daily. 14 tablet 0 More than a month at Unknown time  . ondansetron (ZOFRAN ODT) 4 MG disintegrating tablet Take 1 tablet (4 mg total) by mouth every 8 (eight) hours as needed for nausea or vomiting. 20 tablet 0 Unknown at Unknown time   Results for orders placed or performed during the hospital encounter of 05/21/17 (from the past 48 hour(s))  CBC with Differential/Platelet     Status: Abnormal   Collection Time: 05/21/17  8:47 AM  Result Value Ref Range   WBC 8.6 4.0 - 10.5 K/uL   RBC 3.66 (L) 3.87 - 5.11 MIL/uL   Hemoglobin 12.3 12.0 - 15.0 g/dL   HCT 47.8 (L) 29.5 - 62.1 %   MCV 96.7 78.0 - 100.0 fL   MCH 33.6 26.0 - 34.0 pg   MCHC 34.7 30.0 - 36.0 g/dL   RDW 30.8 65.7 - 84.6 %   Platelets 250 150 - 400 K/uL   Neutrophils Relative % 64 %   Neutro Abs 5.4 1.7 - 7.7 K/uL   Lymphocytes Relative 32 %  Lymphs Abs 2.8 0.7 - 4.0 K/uL   Monocytes Relative 3 %   Monocytes Absolute 0.3 0.1 - 1.0 K/uL   Eosinophils Relative 1 %   Eosinophils Absolute 0.1 0.0 - 0.7 K/uL   Basophils Relative 0 %   Basophils Absolute 0.0 0.0 - 0.1 K/uL  Wet prep, genital     Status: Abnormal   Collection Time: 05/21/17  8:51 AM  Result Value Ref Range   Yeast Wet Prep HPF POC NONE SEEN NONE SEEN   Trich, Wet Prep NONE SEEN NONE SEEN   Clue Cells Wet Prep HPF POC NONE SEEN NONE SEEN   WBC, Wet Prep HPF POC MANY (A) NONE SEEN    Comment: MANY BACTERIA SEEN   Sperm NONE SEEN    Review of Systems  Constitutional: Negative for fever.  Gastrointestinal: Positive for abdominal pain and nausea.  Genitourinary: Positive for dyspareunia, dysuria and pelvic pain.   Physical Exam   Blood pressure (!) 109/59, pulse (!) 54, temperature 98.6 F (37 C), temperature source Oral, resp. rate 15, height  (1.575 m), weight 135 lb (61.2 kg), last menstrual period 04/27/2017, SpO2  100 %.  Physical Exam  Constitutional: She is oriented to person, place, and time. She appears well-developed and well-nourished. No distress.  HENT:  Head: Normocephalic.  GI: Soft. Normal appearance. There is tenderness in the suprapubic area. There is no rigidity, no rebound and no guarding.  Genitourinary:  Genitourinary Comments: Vagina - moderate amount of yellow vaginal discharge, no odor  Cervix - No contact bleeding, no active bleeding  Bimanual exam: Cervix closed, + CMT  Uterus non tender, normal size Adnexa non tender, no masses bilaterally, + suprapubic tenderness.  GC/Chlam, wet prep done Chaperone present for exam.   Musculoskeletal: Normal range of motion.  Neurological: She is alert and oriented to person, place, and time.  Skin: Skin is warm. She is not diaphoretic.  Psychiatric: Her behavior is normal.   MAU Course  Procedures  None  MDM  CBC, Wet prep & GC  Rocephin 250 mg given IM Azithromycin 1 gram given PO   Assessment and Plan   A:  1. PID (acute pelvic inflammatory disease)   2. Pelvic pain     P:  Discharge home in stable condition Patient to repeat Azithromycin in 1 week (1 gram) No intercourse for 14 days Condoms always  Rasch, Harolyn Rutherford, NP 05/21/2017 1:10 PM

## 2017-05-23 LAB — GC/CHLAMYDIA PROBE AMP (~~LOC~~) NOT AT ARMC
Chlamydia: NEGATIVE
Neisseria Gonorrhea: NEGATIVE

## 2018-07-30 ENCOUNTER — Encounter (HOSPITAL_COMMUNITY): Payer: Self-pay

## 2018-07-30 ENCOUNTER — Inpatient Hospital Stay (HOSPITAL_COMMUNITY)
Admission: AD | Admit: 2018-07-30 | Discharge: 2018-07-30 | Disposition: A | Discharge status: Home / Self Care | Payer: Medicaid Other | Source: Ambulatory Visit | Attending: Obstetrics & Gynecology | Admitting: Obstetrics & Gynecology

## 2018-07-30 DIAGNOSIS — N760 Acute vaginitis: Secondary | ICD-10-CM

## 2018-07-30 DIAGNOSIS — R102 Pelvic and perineal pain: Secondary | ICD-10-CM

## 2018-07-30 DIAGNOSIS — B9689 Other specified bacterial agents as the cause of diseases classified elsewhere: Secondary | ICD-10-CM | POA: Insufficient documentation

## 2018-07-30 DIAGNOSIS — Z87891 Personal history of nicotine dependence: Secondary | ICD-10-CM | POA: Insufficient documentation

## 2018-07-30 DIAGNOSIS — R109 Unspecified abdominal pain: Secondary | ICD-10-CM | POA: Insufficient documentation

## 2018-07-30 HISTORY — DX: Other specified bacterial agents as the cause of diseases classified elsewhere: B96.89

## 2018-07-30 HISTORY — DX: Other specified bacterial agents as the cause of diseases classified elsewhere: N76.0

## 2018-07-30 HISTORY — DX: Unspecified ovarian cyst, unspecified side: N83.209

## 2018-07-30 LAB — CBC
HEMATOCRIT: 34.2 % — AB (ref 36.0–46.0)
HEMOGLOBIN: 11.5 g/dL — AB (ref 12.0–15.0)
MCH: 33.7 pg (ref 26.0–34.0)
MCHC: 33.6 g/dL (ref 30.0–36.0)
MCV: 100.3 fL — AB (ref 80.0–100.0)
Platelets: 221 10*3/uL (ref 150–400)
RBC: 3.41 MIL/uL — ABNORMAL LOW (ref 3.87–5.11)
RDW: 11.7 % (ref 11.5–15.5)
WBC: 8.2 10*3/uL (ref 4.0–10.5)
nRBC: 0 % (ref 0.0–0.2)

## 2018-07-30 LAB — WET PREP, GENITAL
SPERM: NONE SEEN
TRICH WET PREP: NONE SEEN
YEAST WET PREP: NONE SEEN

## 2018-07-30 LAB — URINALYSIS, ROUTINE W REFLEX MICROSCOPIC
Bilirubin Urine: NEGATIVE
Glucose, UA: NEGATIVE mg/dL
Hgb urine dipstick: NEGATIVE
KETONES UR: NEGATIVE mg/dL
LEUKOCYTES UA: NEGATIVE
NITRITE: NEGATIVE
PH: 6 (ref 5.0–8.0)
PROTEIN: NEGATIVE mg/dL
Specific Gravity, Urine: 1.014 (ref 1.005–1.030)

## 2018-07-30 LAB — POCT PREGNANCY, URINE: PREG TEST UR: NEGATIVE

## 2018-07-30 MED ORDER — CLINDAMYCIN HCL 300 MG PO CAPS: 300.0000 mg | ORAL_CAPSULE | Freq: Two times a day (BID) | ORAL | 0 refills | Status: DC

## 2018-07-30 NOTE — Discharge Instructions (Signed)
[facility] [employer] for [employer] at [facility]       Phone: [phone]  Center for [employer] at [place] Phone: [phone]  Center for [employer] at [place]  Phone: [phone]  Center for [employer] at [employer]  Phone: [phone]  Center for [facility] at [place]  Phone: [phone]  [facility]       Phone: [phone]  [facility]    Phone: [phone]   [facility])    Phone: [phone]  [facility]    Phone: [phone]  [facility]                                     Phone: [phone]  [facility]    Phone: [phone]  [facility]    Phone: [phone]  [facility]       Phone: [phone]   [facility]  Phone: [phone]  [facility]    Phone: [phone]  [facility]   Phone: [phone]  [facility]      Phone: [phone]  [facility]    Phone: [phone]    Bacterial Vaginosis Bacterial vaginosis is a vaginal infection that occurs when the normal balance of bacteria in the vagina is disrupted. It results from an overgrowth of certain bacteria. This is the most common vaginal infection among women ages 75-44. Because bacterial vaginosis increases your risk for STIs (sexually transmitted infections), getting treated can help reduce your risk for chlamydia, gonorrhea, herpes, and HIV (human immunodeficiency virus). Treatment is also important for preventing complications in pregnant women, because this condition can cause an early (premature) delivery. What are the causes? This condition is caused by an increase in harmful bacteria that are normally present in small  amounts in the vagina. However, the reason that the condition develops is not fully understood. What increases the risk? The following factors may make you more likely to develop this condition:  Having a new sexual partner or multiple sexual partners.  Having unprotected sex.  Douching.  Having an intrauterine device (IUD).  Smoking.  Drug and alcohol abuse.  Taking certain antibiotic medicines.  Being pregnant.  You cannot get bacterial vaginosis from toilet seats, bedding, swimming pools, or contact with objects around you. What are the signs or symptoms? Symptoms of this condition include:  Grey or white vaginal discharge. The discharge can also be watery or foamy.  A fish-like odor with discharge, especially after sexual intercourse or during menstruation.  Itching in and around the vagina.  Burning or pain with urination.  Some women with bacterial vaginosis have no signs or symptoms. How is this diagnosed? This condition is diagnosed based on:  Your medical history.  A physical exam of the vagina.  Testing a sample of vaginal fluid under a microscope to look for a large amount of bad bacteria or abnormal cells. Your health care provider may use a cotton swab or a small wooden spatula to collect the sample.  How is this treated? This condition is treated with antibiotics. These may be given as a pill, a vaginal cream, or a medicine that is put into the vagina (suppository). If the condition comes back after treatment, a second round of antibiotics may be needed. Follow these instructions  at home: Medicines  Take over-the-counter and prescription medicines only as told by your health care provider.  Take or use your antibiotic as told by your health care provider. Do not stop taking or using the antibiotic even if you start to feel better. General instructions  If you have a female sexual partner, tell her that you have a vaginal infection. She should see her  health care provider and be treated if she has symptoms. If you have a female sexual partner, he does not need treatment.  During treatment: ? Avoid sexual activity until you finish treatment. ? Do not douche. ? Avoid alcohol as directed by your health care provider. ? Avoid breastfeeding as directed by your health care provider.  Drink enough water and fluids to keep your urine clear or pale yellow.  Keep the area around your vagina and rectum clean. ? Wash the area daily with warm water. ? Wipe yourself from front to back after using the toilet.  Keep all follow-up visits as told by your health care provider. This is important. How is this prevented?  Do not douche.  Wash the outside of your vagina with warm water only.  Use protection when having sex. This includes latex condoms and dental dams.  Limit how many sexual partners you have. To help prevent bacterial vaginosis, it is best to have sex with just one partner (monogamous).  Make sure you and your sexual partner are tested for STIs.  Wear cotton or cotton-lined underwear.  Avoid wearing tight pants and pantyhose, especially during summer.  Limit the amount of alcohol that you drink.  Do not use any products that contain nicotine or tobacco, such as cigarettes and e-cigarettes. If you need help quitting, ask your health care provider.  Do not use illegal drugs. Where to find more information:  Centers for Disease Control and Prevention: SolutionApps.co.zawww.cdc.gov/std  American Sexual Health Association (ASHA): www.ashastd.org  U.S. Department of Health and Health and safety inspectorHuman Services, Office on Women's Health: ConventionalMedicines.siwww.womenshealth.gov/ or http://www.anderson-williamson.info/https://www.womenshealth.gov/a-z-topics/bacterial-vaginosis Contact a health care provider if:  Your symptoms do not improve, even after treatment.  You have more discharge or pain when urinating.  You have a fever.  You have pain in your abdomen.  You have pain during sex.  You have vaginal bleeding  between periods. Summary  Bacterial vaginosis is a vaginal infection that occurs when the normal balance of bacteria in the vagina is disrupted.  Because bacterial vaginosis increases your risk for STIs (sexually transmitted infections), getting treated can help reduce your risk for chlamydia, gonorrhea, herpes, and HIV (human immunodeficiency virus). Treatment is also important for preventing complications in pregnant women, because the condition can cause an early (premature) delivery.  This condition is treated with antibiotic medicines. These may be given as a pill, a vaginal cream, or a medicine that is put into the vagina (suppository). This information is not intended to replace advice given to you by your health care provider. Make sure you discuss any questions you have with your health care provider. Document Released: 08/16/2005 Document Revised: 12/20/2016 Document Reviewed: 05/01/2016 Elsevier Interactive Patient Education  2018 Elsevier Inc.     Pelvic Pain, Female Pelvic pain is pain in your lower abdomen, below your belly button and between your hips. The pain may start suddenly (acute), keep coming back (recurring), or last a long time (chronic). Pelvic pain that lasts longer than six months is considered chronic. Pelvic pain may affect your:  Reproductive organs.  Urinary system.  Digestive tract.  Musculoskeletal system.  There are many potential causes of pelvic pain. Sometimes, the pain can be a result of digestive or urinary conditions, strained muscles or ligaments, or even reproductive conditions. Sometimes the cause of pelvic pain is not known. Follow these instructions at home:  Take over-the-counter and prescription medicines only as told by your health care provider.  Rest as told by your health care provider.  Do not have sex it if hurts.  Keep a journal of your pelvic pain. Write down: ? When the pain started. ? Where the pain is located. ? What  seems to make the pain better or worse, such as food or your menstrual cycle. ? Any symptoms you have along with the pain.  Keep all follow-up visits as told by your health care provider. This is important. Contact a health care provider if:  Medicine does not help your pain.  Your pain comes back.  You have new symptoms.  You have abnormal vaginal discharge or bleeding, including bleeding after menopause.  You have a fever or chills.  You are constipated.  You have blood in your urine or stool.  You have foul-smelling urine.  You feel weak or lightheaded. Get help right away if:  You have sudden severe pain.  Your pain gets steadily worse.  You have severe pain along with fever, nausea, vomiting, or excessive sweating.  You lose consciousness. This information is not intended to replace advice given to you by your health care provider. Make sure you discuss any questions you have with your health care provider. Document Released: 07/13/2004 Document Revised: 09/10/2015 Document Reviewed: 06/06/2015 Elsevier Interactive Patient Education  2018 [employer].

## 2018-07-30 NOTE — MAU Provider Note (Signed)
Chief Complaint: Vaginal Discharge and Pelvic Pain   First Provider Initiated Contact with Patient 07/30/18 1031     SUBJECTIVE HPI: Debra Boyd is a 27 y.o. non pregnant female who presents to Maternity Admissions reporting abdominal pain & vaginal discharge.  These are recurrent symptoms. States normally has pain like this monthly with her menses & after ovulation. Current symptoms lasting longer & worse than normal for her. States feels somewhat like last year when she had PID. Has had 3 episodes of BV in the last year. Normally treats self with boric acid suppositories. Reports thin watery gray discharge with foul odor.  Has been with partner x 10 years. Does not use condoms as they are TTC.  Onset of dyspareunia since last month.  Denies fever/chills, vaginal bleeding, dysuria, or n/v/d.   Location: RLQ and pelvis Quality: sharp Severity: 4/10 on pain scale Duration: 3 days Timing: intermittent Modifying factors: nothing makes better or worse. Hasn't treated pain Associated signs and symptoms: vaginal discharge  Past Medical History:  Diagnosis Date  . Bacterial vaginosis   . Chlamydia   . Depression   . Genital HSV   . Gonorrhea   . Ovarian cyst   . Pelvic inflammatory disease   . Trichomonas    OB History  Gravida Para Term Preterm AB Living  1 1 1     1   SAB TAB Ectopic Multiple Live Births          1    # Outcome Date GA Lbr Len/2nd Weight Sex Delivery Anes PTL Lv  1 Term 10/10/09    F CS-LTranv EPI  LIV   Past Surgical History:  Procedure Laterality Date  . CESAREAN SECTION     Social History   Socioeconomic History  . Marital status: Divorced    Spouse name: Not on file  . Number of children: Not on file  . Years of education: Not on file  . Highest education level: Not on file  Occupational History  . Not on file  Social Needs  . Financial resource strain: Not on file  . Food insecurity:    Worry: Not on file    Inability: Not on file  .  Transportation needs:    Medical: Not on file    Non-medical: Not on file  Tobacco Use  . Smoking status: Former Smoker    Packs/day: 0.50    Types: Cigarettes  . Smokeless tobacco: Never Used  . Tobacco comment: quit 3 years ago  Substance and Sexual Activity  . Alcohol use: Yes    Comment: occasionally  . Drug use: Not Currently    Types: Marijuana    Comment: last used in [id number]  . Sexual activity: Yes    Birth control/protection: None  Lifestyle  . Physical activity:    Days per week: Not on file    Minutes per session: Not on file  . Stress: Not on file  Relationships  . Social connections:    Talks on phone: Not on file    Gets together: Not on file    Attends religious service: Not on file    Active member of club or organization: Not on file    Attends meetings of clubs or organizations: Not on file    Relationship status: Not on file  . Intimate partner violence:    Fear of current or ex partner: Not on file    Emotionally abused: Not on file    Physically abused: Not on file  Forced sexual activity: Not on file  Other Topics Concern  . Not on file  Social History Narrative  . Not on file   Family History  Problem Relation Age of Onset  . Diabetes Maternal Aunt   . Kidney disease Paternal Aunt   . Heart disease Maternal Grandmother   . Hypertension Maternal Grandmother   . Hyperlipidemia Maternal Grandmother   . Lupus Maternal Aunt   . Diabetes Mother    No current facility-administered medications on file prior to encounter.    Current Outpatient Medications on File Prior to Encounter  Medication Sig Dispense Refill  . valACYclovir (VALTREX) 1000 MG tablet Take 1,000 mg by mouth daily.      No Known Allergies  I have reviewed patient's Past Medical Hx, Surgical Hx, Family Hx, Social Hx, medications and allergies.   Review of Systems  Constitutional: Negative.   Gastrointestinal: Positive for abdominal pain. Negative for constipation,  diarrhea, nausea and vomiting.  Genitourinary: Positive for dyspareunia and vaginal discharge. Negative for dysuria, flank pain, genital sores and vaginal bleeding.    OBJECTIVE Patient Vitals for the past 24 hrs:  BP Temp Temp src Pulse Resp Weight  07/30/18 1255 106/62 - - (!) 54 18 -  07/30/18 0955 112/67 98.4 F (36.9 C) Oral 67 18 59.5 kg   Constitutional: Well-developed, well-nourished female in no acute distress.  Cardiovascular: normal rate & rhythm, no murmur Respiratory: normal rate and effort. Lung sounds clear throughout GI: Abd soft, non-tender, Pos BS x 4. No guarding or rebound tenderness MS: Extremities nontender, no edema, normal ROM Neurologic: Alert and oriented x 4.  GU:     SPECULUM EXAM: NEFG, small amount of thin gray foul smelling discharge. Cervix pink & smooth  BIMANUAL: No CMT. uterus normal size, no adnexal tenderness or masses.    LAB RESULTS Results for orders placed or performed during the hospital encounter of 07/30/18 (from the past 24 hour(s))  Urinalysis, Routine w reflex microscopic     Status: None   Collection Time: 07/30/18 10:01 AM  Result Value Ref Range   Color, Urine YELLOW YELLOW   APPearance CLEAR CLEAR   Specific Gravity, Urine 1.014 1.005 - 1.030   pH 6.0 5.0 - 8.0   Glucose, UA NEGATIVE NEGATIVE mg/dL   Hgb urine dipstick NEGATIVE NEGATIVE   Bilirubin Urine NEGATIVE NEGATIVE   Ketones, ur NEGATIVE NEGATIVE mg/dL   Protein, ur NEGATIVE NEGATIVE mg/dL   Nitrite NEGATIVE NEGATIVE   Leukocytes, UA NEGATIVE NEGATIVE  Pregnancy, urine POC     Status: None   Collection Time: 07/30/18 10:12 AM  Result Value Ref Range   Preg Test, Ur NEGATIVE NEGATIVE  Wet prep, genital     Status: Abnormal   Collection Time: 07/30/18 11:18 AM  Result Value Ref Range   Yeast Wet Prep HPF POC NONE SEEN NONE SEEN   Trich, Wet Prep NONE SEEN NONE SEEN   Clue Cells Wet Prep HPF POC PRESENT (A) NONE SEEN   WBC, Wet Prep HPF POC MANY (A) NONE SEEN    Sperm NONE SEEN   CBC     Status: Abnormal   Collection Time: 07/30/18 11:28 AM  Result Value Ref Range   WBC 8.2 4.0 - 10.5 K/uL   RBC 3.41 (L) 3.87 - 5.11 MIL/uL   Hemoglobin 11.5 (L) 12.0 - 15.0 g/dL   HCT 84.1 (L) 32.4 - 40.1 %   MCV 100.3 (H) 80.0 - 100.0 fL   MCH 33.7 26.0 -  34.0 pg   MCHC 33.6 30.0 - 36.0 g/dL   RDW 21.3 08.6 - 57.8 %   Platelets 221 150 - 400 K/uL   nRBC 0.0 0.0 - 0.2 %    IMAGING No results found.  MAU COURSE Orders Placed This Encounter  Procedures  . Wet prep, genital  . Urinalysis, Routine w reflex microscopic  . CBC  . Pregnancy, urine POC  . Discharge patient   Meds ordered this encounter  Medications  . clindamycin (CLEOCIN) 300 MG capsule    Sig: Take 1 capsule (300 mg total) by mouth 2 (two) times daily.    Dispense:  14 capsule    Refill:  0    Order Specific Question:   Supervising Provider    Answer:   Jaynie Collins A [[id number]]    MDM UPT negative VSS, NAD Pt afebrile. CBC -- no leukocytosis GC/CT & wet prep collected Will tx for BV, pt doesn't want flagyl, will give clindamycin d/t recurrence No evidence of adnexal mass or PID at this time.  Pt to establish with gyn for recurrent pain  ASSESSMENT 1. BV (bacterial vaginosis)   2. Pelvic pain in female     PLAN Discharge home in stable condition. Rx clindamycin GC/CT pending  Allergies as of 07/30/2018   No Known Allergies     Medication List    STOP taking these medications   azithromycin 250 MG tablet Commonly known as:  ZITHROMAX     TAKE these medications   clindamycin 300 MG capsule Commonly known as:  CLEOCIN Take 1 capsule (300 mg total) by mouth 2 (two) times daily.   valACYclovir 1000 MG tablet Commonly known as:  VALTREX Take 1,000 mg by mouth daily.        Judeth Horn, NP 07/30/2018  4:52 PM

## 2018-07-30 NOTE — MAU Note (Signed)
Right sided pelvic pain ongoing for a couple months. States it sometimes radiates down her leg, through her back, or across her abdomen. Pain is genenrally intermittent but for the past 3 days it has been constant. Has not tried any medication  No bleeding  Having gray watery discharge and some burning

## 2018-07-31 LAB — GC/CHLAMYDIA PROBE AMP (~~LOC~~) NOT AT ARMC
CHLAMYDIA, DNA PROBE: NEGATIVE
NEISSERIA GONORRHEA: NEGATIVE

## 2018-08-20 ENCOUNTER — Other Ambulatory Visit: Payer: Self-pay

## 2018-08-20 ENCOUNTER — Encounter (HOSPITAL_COMMUNITY): Payer: Self-pay | Admitting: Emergency Medicine

## 2018-08-20 ENCOUNTER — Emergency Department (HOSPITAL_COMMUNITY): Payer: Medicaid Other

## 2018-08-20 ENCOUNTER — Emergency Department (HOSPITAL_COMMUNITY)
Admission: EM | Admit: 2018-08-20 | Discharge: 2018-08-20 | Disposition: A | Discharge status: Home / Self Care | Payer: Medicaid Other | Attending: Emergency Medicine | Admitting: Emergency Medicine

## 2018-08-20 DIAGNOSIS — Y998 Other external cause status: Secondary | ICD-10-CM | POA: Insufficient documentation

## 2018-08-20 DIAGNOSIS — Y9351 Activity, roller skating (inline) and skateboarding: Secondary | ICD-10-CM | POA: Insufficient documentation

## 2018-08-20 DIAGNOSIS — Y929 Unspecified place or not applicable: Secondary | ICD-10-CM | POA: Insufficient documentation

## 2018-08-20 DIAGNOSIS — S63501A Unspecified sprain of right wrist, initial encounter: Secondary | ICD-10-CM | POA: Insufficient documentation

## 2018-08-20 DIAGNOSIS — Z87891 Personal history of nicotine dependence: Secondary | ICD-10-CM | POA: Insufficient documentation

## 2018-08-20 DIAGNOSIS — Z79899 Other long term (current) drug therapy: Secondary | ICD-10-CM | POA: Insufficient documentation

## 2018-08-20 MED ORDER — NAPROXEN 250 MG PO TABS
500.0000 mg | ORAL_TABLET | Freq: Once | ORAL | Status: AC
  Administered 2018-08-20: 500 mg via ORAL
  Filled 2018-08-20: qty 2

## 2018-08-20 MED ORDER — NAPROXEN 500 MG PO TABS: 500.0000 mg | ORAL_TABLET | Freq: Two times a day (BID) | ORAL | 0 refills | Status: DC

## 2018-08-20 NOTE — Discharge Instructions (Addendum)
You were seen today for right wrist pain.  Your x-rays do not show any acute fracture.  This could be a wrist sprain.  Keep splinted and wrist splint.  Ice, elevate.  Take naproxen as needed for pain.  Occasionally as small fracture can be missed on initial x-rays.  If you have continued pain, follow-up 1 week for repeat x-rays.

## 2018-08-20 NOTE — ED Triage Notes (Signed)
Pt c/o right wrist pain after falling while skating yesterday.

## 2018-08-20 NOTE — ED Provider Notes (Signed)
[facility] EMERGENCY DEPARTMENT Provider Note   CSN: [id number] Arrival date & time: 08/20/18  0042     History   Chief Complaint Chief Complaint  Patient presents with  . Wrist Pain    HPI Debra Boyd is a 27 y.o. female.  HPI  This is a 27 year old female who presents with right wrist pain.  Patient reports that she was with her daughter when her daughter was skating and fell.  This caused the patient to fall.  She reports that she fell out on an outstretched right hand.  She reports that she is right-handed.  She reports pain mostly in the right wrist but reports that it radiates into her hand and up her arm.  She denies numbness or tingling.  She rates her pain 8 out of 10.  She has not taken anything for the pain.  She denies hitting her head or loss of consciousness.  She denies any other symptoms.  Past Medical History:  Diagnosis Date  . Bacterial vaginosis   . Chlamydia   . Depression   . Genital HSV   . Gonorrhea   . Ovarian cyst   . Pelvic inflammatory disease   . Trichomonas     There are no active problems to display for this patient.   Past Surgical History:  Procedure Laterality Date  . CESAREAN SECTION       OB History    Gravida  1   Para  1   Term  1   Preterm      AB      Living  1     SAB      TAB      Ectopic      Multiple      Live Births  1            Home Medications    Prior to Admission medications   Medication Sig Start Date End Date Taking? Authorizing Provider  clindamycin (CLEOCIN) 300 MG capsule Take 1 capsule (300 mg total) by mouth 2 (two) times daily. 07/30/18   Judeth Horn, NP  naproxen (NAPROSYN) 500 MG tablet Take 1 tablet (500 mg total) by mouth 2 (two) times daily. 08/20/18   Archibald Marchetta, Mayer Masker, MD  valACYclovir (VALTREX) 1000 MG tablet Take 1,000 mg by mouth daily.     [provider]    Family History Family History  Problem Relation Age of Onset  .  Diabetes Maternal Aunt   . Kidney disease Paternal Aunt   . Heart disease Maternal Grandmother   . Hypertension Maternal Grandmother   . Hyperlipidemia Maternal Grandmother   . Lupus Maternal Aunt   . Diabetes Mother     Social History Social History   Tobacco Use  . Smoking status: Former Smoker    Packs/day: 0.50    Types: Cigarettes  . Smokeless tobacco: Never Used  . Tobacco comment: quit 3 years ago  Substance Use Topics  . Alcohol use: Yes    Comment: occasionally  . Drug use: Not Currently    Types: Marijuana    Comment: last used in [id number]     Allergies   Patient has no known allergies.   Review of Systems Review of Systems  Musculoskeletal:       Right wrist pain  Skin: Negative for color change and wound.  Neurological: Negative for weakness and numbness.  All other systems reviewed and are negative.    Physical Exam Updated Vital  Signs BP 119/80 (BP Location: Right Arm)   Pulse 68   Temp 98 F (36.7 C) (Oral)   Resp 18   LMP 08/01/2018   SpO2 100%   Physical Exam Vitals signs and nursing note reviewed.  Constitutional:      Appearance: She is well-developed.     Comments: Tearful, nontoxic-appearing  HENT:     Head: Normocephalic and atraumatic.  Cardiovascular:     Rate and Rhythm: Normal rate and regular rhythm.  Pulmonary:     Effort: Pulmonary effort is normal. No respiratory distress.  Musculoskeletal:     Comments: Tenderness to palpation over the right dorsum dorsum of the right wrist with slight swelling noted over the radial aspect of the wrist, no snuffbox tenderness, limited range of motion secondary to pain, normal flexion and extension to all 5 digits, normal range of motion of the elbow, no significant deformities noted, 2+ radial pulse  Skin:    General: Skin is warm and dry.  Neurological:     Mental Status: She is alert and oriented to person, place, and time.      ED Treatments / Results  Labs (all labs ordered  are listed, but only abnormal results are displayed) Labs Reviewed - No data to display  EKG None  Radiology Dg Wrist Complete Right  Result Date: 08/20/2018 CLINICAL DATA:  Wrist pain after a fall. EXAM: RIGHT WRIST - COMPLETE 3+ VIEW COMPARISON:  None. FINDINGS: No acute fracture or dislocation. Scaphoid intact. IMPRESSION: No acute osseous abnormality. Electronically Signed   By: Jeronimo GreavesKyle  Talbot M.D.   On: 08/20/2018 01:42    Procedures Procedures (including critical care time)  Medications Ordered in ED Medications  naproxen (NAPROSYN) tablet 500 mg (500 mg Oral Given 08/20/18 0306)     Initial Impression / Assessment and Plan / ED Course  I have reviewed the triage vital signs and the nursing notes.  Pertinent labs & imaging results that were available during my care of the patient were reviewed by me and considered in my medical decision making (see chart for details).     Presents with right wrist pain following a fall.  She is overall nontoxic-appearing.  She is very tearful.  Exam is somewhat limited secondary to pain but no obvious deformities or neurovascular deficits noted.  X-ray is negative for acute fracture.  I have reviewed the x-ray myself and do not see any evidence of fracture.  She has no snuffbox tenderness.  Would have low suspicion for occult scaphoid fracture at this time.  Patient is tearful out of proportion to exam.  She states that she does not believe that "nothing is wrong."  I discussed with her that she likely has a sprain and that rest, ice, elevation, and anti-inflammatories would be helpful.  I did also discussed with her that on occasion, an acute fracture can be missed and if she has continued pain in 1 week she can have repeat x-rays.  Patient does not seem satisfied with this answer.  I have ordered a wrist splint for her.  Per orthopedic tech, patient stated that this made it hurt worse.  He has offered to place a volar splint.  I have approved.   Continue recommendations of rest, ice and anti-inflammatories.  After history, exam, and medical workup I feel the patient has been appropriately medically screened and is safe for discharge home. Pertinent diagnoses were discussed with the patient. Patient was given return precautions.   Final Clinical Impressions(s) /  ED Diagnoses   Final diagnoses:  Sprain of right wrist, initial encounter    ED Discharge Orders         Ordered    naproxen (NAPROSYN) 500 MG tablet  2 times daily     08/20/18 0316           Tamim Skog, Mayer Maskerourtney F, MD 08/20/18 [phone]

## 2019-03-27 ENCOUNTER — Other Ambulatory Visit: Payer: Self-pay | Admitting: Obstetrics & Gynecology

## 2019-03-29 ENCOUNTER — Other Ambulatory Visit: Payer: Self-pay | Admitting: Obstetrics & Gynecology

## 2019-03-29 DIAGNOSIS — Z319 Encounter for procreative management, unspecified: Secondary | ICD-10-CM

## 2019-04-26 ENCOUNTER — Ambulatory Visit
Admission: RE | Admit: 2019-04-26 | Discharge: 2019-04-26 | Disposition: A | Discharge status: Home / Self Care | Payer: Self-pay | Source: Ambulatory Visit | Attending: Obstetrics & Gynecology | Admitting: Obstetrics & Gynecology

## 2019-04-26 DIAGNOSIS — Z319 Encounter for procreative management, unspecified: Secondary | ICD-10-CM

## 2019-10-30 ENCOUNTER — Encounter (HOSPITAL_COMMUNITY): Payer: Self-pay | Admitting: Obstetrics and Gynecology

## 2019-10-30 ENCOUNTER — Other Ambulatory Visit: Payer: Self-pay

## 2019-10-30 ENCOUNTER — Inpatient Hospital Stay (HOSPITAL_COMMUNITY)
Admission: AD | Admit: 2019-10-30 | Discharge: 2019-10-31 | Disposition: A | Discharge status: Home / Self Care | Payer: No Typology Code available for payment source | Attending: Obstetrics & Gynecology | Admitting: Obstetrics & Gynecology

## 2019-10-30 DIAGNOSIS — Z87891 Personal history of nicotine dependence: Secondary | ICD-10-CM | POA: Insufficient documentation

## 2019-10-30 DIAGNOSIS — O26891 Other specified pregnancy related conditions, first trimester: Secondary | ICD-10-CM

## 2019-10-30 DIAGNOSIS — R1030 Lower abdominal pain, unspecified: Secondary | ICD-10-CM | POA: Insufficient documentation

## 2019-10-30 DIAGNOSIS — O00101 Right tubal pregnancy without intrauterine pregnancy: Secondary | ICD-10-CM | POA: Insufficient documentation

## 2019-10-30 NOTE — MAU Note (Signed)
PT SAYS SHE DID HPT ON 2-22- POSITIVE-  SAME DAY STARTED BLEEDING WITH DARK CLOTS. CONTINUED EVERYDAY UNTIL 2-28.   THEN YESTERDAY AND TODAY SPOTTING. .  THEN TONIGHT AT WORK- AFTER DRINKING  TEA- ABD PAIN.  HAD BM AND VOMITED.  PAIN IS LESS THAN WHEN BLEEDING- BUT FOCUSED ON RIGHT SIDE - TO BACK.

## 2019-10-31 ENCOUNTER — Inpatient Hospital Stay (HOSPITAL_COMMUNITY): Payer: No Typology Code available for payment source

## 2019-10-31 ENCOUNTER — Inpatient Hospital Stay (EMERGENCY_DEPARTMENT_HOSPITAL)
Admission: AD | Admit: 2019-10-31 | Discharge: 2019-10-31 | Disposition: A | Discharge status: Home / Self Care | Payer: No Typology Code available for payment source | Source: Home / Self Care | Attending: Obstetrics & Gynecology | Admitting: Obstetrics & Gynecology

## 2019-10-31 ENCOUNTER — Encounter (HOSPITAL_COMMUNITY): Payer: Self-pay | Admitting: Obstetrics & Gynecology

## 2019-10-31 ENCOUNTER — Encounter: Payer: Self-pay | Admitting: Student

## 2019-10-31 DIAGNOSIS — O00101 Right tubal pregnancy without intrauterine pregnancy: Secondary | ICD-10-CM

## 2019-10-31 DIAGNOSIS — R109 Unspecified abdominal pain: Secondary | ICD-10-CM | POA: Diagnosis present

## 2019-10-31 DIAGNOSIS — Z87891 Personal history of nicotine dependence: Secondary | ICD-10-CM | POA: Diagnosis not present

## 2019-10-31 DIAGNOSIS — R1030 Lower abdominal pain, unspecified: Secondary | ICD-10-CM | POA: Diagnosis not present

## 2019-10-31 LAB — POCT PREGNANCY, URINE: Preg Test, Ur: POSITIVE — AB

## 2019-10-31 LAB — COMPREHENSIVE METABOLIC PANEL
ALT: 14 U/L (ref 0–44)
AST: 18 U/L (ref 15–41)
Albumin: 3.6 g/dL (ref 3.5–5.0)
Alkaline Phosphatase: 59 U/L (ref 38–126)
Anion gap: 7 (ref 5–15)
BUN: 11 mg/dL (ref 6–20)
CO2: 26 mmol/L (ref 22–32)
Calcium: 8.8 mg/dL — ABNORMAL LOW (ref 8.9–10.3)
Chloride: 102 mmol/L (ref 98–111)
Creatinine, Ser: 0.7 mg/dL (ref 0.44–1.00)
GFR calc Af Amer: >60 mL/min (ref >60)
GFR calc non Af Amer: >60 mL/min (ref >60)
Glucose, Bld: 119 mg/dL — ABNORMAL HIGH (ref 70–99)
Potassium: 3.5 mmol/L (ref 3.5–5.1)
Sodium: 135 mmol/L (ref 135–145)
Total Bilirubin: 0.8 mg/dL (ref 0.3–1.2)
Total Protein: 6.6 g/dL (ref 6.5–8.1)

## 2019-10-31 LAB — ABO/RH: ABO/RH(D): O POS

## 2019-10-31 LAB — CBC
HCT: 31.4 % — ABNORMAL LOW (ref 36.0–46.0)
Hemoglobin: 10.4 g/dL — ABNORMAL LOW (ref 12.0–15.0)
MCH: 33.4 pg (ref 26.0–34.0)
MCHC: 33.1 g/dL (ref 30.0–36.0)
MCV: 101 fL — ABNORMAL HIGH (ref 80.0–100.0)
Platelets: 245 10*3/uL (ref 150–400)
RBC: 3.11 MIL/uL — ABNORMAL LOW (ref 3.87–5.11)
RDW: 11 % — ABNORMAL LOW (ref 11.5–15.5)
WBC: 13.3 10*3/uL — ABNORMAL HIGH (ref 4.0–10.5)
nRBC: 0 % (ref 0.0–0.2)

## 2019-10-31 LAB — URINALYSIS, ROUTINE W REFLEX MICROSCOPIC
Bilirubin Urine: NEGATIVE
Glucose, UA: NEGATIVE mg/dL
Hgb urine dipstick: NEGATIVE
Ketones, ur: NEGATIVE mg/dL
Leukocytes,Ua: NEGATIVE
Nitrite: NEGATIVE
Protein, ur: NEGATIVE mg/dL
Specific Gravity, Urine: 1.025 (ref 1.005–1.030)
pH: 6 (ref 5.0–8.0)

## 2019-10-31 LAB — HCG, QUANTITATIVE, PREGNANCY: hCG, Beta Chain, Quant, S: 698 m[IU]/mL — ABNORMAL HIGH (ref <5)

## 2019-10-31 MED ORDER — METHOTREXATE FOR ECTOPIC PREGNANCY
50.0000 mg/m2 | Freq: Once | INTRAMUSCULAR | Status: AC
  Administered 2019-10-31: 80 mg via INTRAMUSCULAR
  Filled 2019-10-31: qty 1

## 2019-10-31 MED ORDER — METHOTREXATE FOR ECTOPIC PREGNANCY
50.0000 mg/m2 | Freq: Once | INTRAMUSCULAR | Status: DC
  Filled 2019-10-31: qty 1

## 2019-10-31 NOTE — MAU Provider Note (Signed)
First Provider Initiated Contact with Patient 10/31/19 [phone]      S [name] is a 29 y.o. G59P1011 female with a known RT ectopic tubal pregnancy measuring 2.1 x 1.4 x 2.0 cm who presents to MAU today to receive MTX injection only. She was seen in MAU at midnight today, but declined MTX that was offered. She spoke with her OB provider this morning and now desires to receive the MTX. She denies that her abdominal pain or bleeding is unchanged from yesterday.  O BP (!) 93/53   Pulse 69   Temp 98.3 F (36.8 C)   Resp 16   Ht 5\' 2"  (1.575 m)   Wt 59.1 kg   SpO2 100%   BMI 23.85 kg/m    Physical Exam  Nursing note and vitals reviewed. Constitutional: She is oriented to person, place, and time. She appears well-developed and well-nourished.  HENT:  Head: Normocephalic and atraumatic.  Eyes: Pupils are equal, round, and reactive to light.  Cardiovascular: Normal rate and regular rhythm.  Respiratory: Effort normal.  GI: Soft.  Genitourinary:    Genitourinary Comments: Not indicated   Musculoskeletal:        General: Normal range of motion.     Cervical back: Normal range of motion.  Neurological: She is alert and oriented to person, place, and time.  Skin: Skin is warm and dry.  Psychiatric: She has a normal mood and affect. Her behavior is normal. Judgment and thought content normal.    A 29 yo G3P1011 female Right Ectopic Pregnancy Medical screening exam complete   P Discharge from MAU in stable condition Warning signs for worsening condition that would warrant emergency follow-up discussed Return Saturday 11/03/2019 and Tuesday 11/06/2019 for repeat HCG levels (written on MAU calendar) Patient may return to MAU as needed for pregnancy related complaints Patient verbalized an understanding of the plan of care and agrees.   01/06/2020, CNM 10/31/2019 10:18 AM

## 2019-10-31 NOTE — Discharge Instructions (Signed)

## 2019-10-31 NOTE — Discharge Instructions (Signed)
Ectopic Pregnancy ° °An ectopic pregnancy is when the fertilized egg attaches (implants) outside the uterus. Most ectopic pregnancies occur in one of the tubes where eggs travel from the ovary to the uterus (fallopian tubes), but the implanting can occur in other locations. In rare cases, ectopic pregnancies occur on the ovary, intestine, pelvis, abdomen, or cervix. In an ectopic pregnancy, the fertilized egg does not have the ability to develop into a normal, healthy baby. °A ruptured ectopic pregnancy is one in which tearing or bursting of a fallopian tube causes internal bleeding. Often, there is intense lower abdominal pain, and vaginal bleeding sometimes occurs. Having an ectopic pregnancy can be life-threatening. If this dangerous condition is not treated, it can lead to blood loss, shock, or even death. °What are the causes? °The most common cause of this condition is damage to one of the fallopian tubes. A fallopian tube may be narrowed or blocked, and that keeps the fertilized egg from reaching the uterus. °What increases the risk? °This condition is more likely to develop in women of childbearing age who have different levels of risk. The levels of risk can be divided into three categories. °High risk °· You have gone through infertility treatment. °· You have had an ectopic pregnancy before. °· You have had surgery on the fallopian tubes, or another surgical procedure, such as an abortion. °· You have had surgery to have the fallopian tubes tied (tubal ligation). °· You have problems or diseases of the fallopian tubes. °· You have been exposed to diethylstilbestrol (DES). This medicine was used until 1971, and it had effects on babies whose mothers took the medicine. °· You become pregnant while using an IUD (intrauterine device) for birth control. °Moderate risk °· You have a history of infertility. °· You have had an STI (sexually transmitted infection). °· You have a history of pelvic inflammatory  disease (PID). °· You have scarring from endometriosis. °· You have multiple sexual partners. °· You smoke. °Low risk °· You have had pelvic surgery. °· You use vaginal douches. °· You became sexually active before age 18. °What are the signs or symptoms? °Common symptoms of this condition include normal pregnancy symptoms, such as missing a period, nausea, tiredness, abdominal pain, breast tenderness, and bleeding. However, ectopic pregnancy will have additional symptoms, such as: °· Pain with intercourse. °· Irregular vaginal bleeding or spotting. °· Cramping or pain on one side or in the lower abdomen. °· Fast heartbeat, low blood pressure, and sweating. °· Passing out while having a bowel movement. °Symptoms of a ruptured ectopic pregnancy and internal bleeding may include: °· Sudden, severe pain in the abdomen and pelvis. °· Dizziness, weakness, light-headedness, or fainting. °· Pain in the shoulder or neck area. °How is this diagnosed? °This condition is diagnosed by: °· A pelvic exam to locate pain or a mass in the abdomen. °· A pregnancy test. This blood test checks for the presence as well as the specific level of pregnancy hormone in the bloodstream. °· Ultrasound. This is performed if a pregnancy test is positive. In this test, a probe is inserted into the vagina. The probe will detect a fetus, possibly in a location other than the uterus. °· Taking a sample of uterus tissue (dilation and curettage, or D&C). °· Surgery to perform a visual exam of the inside of the abdomen using a thin, lighted tube that has a tiny camera on the end (laparoscope). °· Culdocentesis. This procedure involves inserting a needle at the top of   the vagina, behind the uterus. If blood is present in this area, it may indicate that a fallopian tube is torn. °How is this treated? °This condition is treated with medicine or surgery. °Medicine °· An injection of a medicine (methotrexate) may be given to cause the pregnancy tissue to be  absorbed. This medicine may save your fallopian tube. It may be given if: °? The diagnosis is made early, with no signs of active bleeding. °? The fallopian tube has not ruptured. °? You are considered to be a good candidate for the medicine. °Usually, pregnancy hormone blood levels are checked after methotrexate treatment. This is to be sure that the medicine is effective. It may take 4-6 weeks for the pregnancy to be absorbed. Most pregnancies will be absorbed by 3 weeks. °Surgery °· A laparoscope may be used to remove the pregnancy tissue. °· If severe internal bleeding occurs, a larger cut (incision) may be made in the lower abdomen (laparotomy) to remove the fetus and placenta. This is done to stop the bleeding. °· Part or all of the fallopian tube may be removed (salpingectomy) along with the fetus and placenta. The fallopian tube may also be repaired during the surgery. °· In very rare circumstances, removal of the uterus (hysterectomy) may be required. °· After surgery, pregnancy hormone testing may be done to be sure that there is no pregnancy tissue left. °Whether your treatment is medicine or surgery, you may receive a Rho (D) immune globulin shot to prevent problems with any future pregnancy. This shot may be given if: °· You are Rh-negative and the baby's father is Rh-positive. °· You are Rh-negative and you do not know the Rh type of the baby's father. °Follow these instructions at home: °· Rest and limit your activity after the procedure for as long as told by your health care provider. °· Until your health care provider says that it is safe: °? Do not lift anything that is heavier than 10 lb (4.5 kg), or the limit that your health care provider tells you. °? Avoid physical exercise and any movement that requires effort (is strenuous). °· To help prevent constipation: °? Eat a healthy diet that includes fruits, vegetables, and whole grains. °? Drink 6-8 glasses of water per day. °Get help right away  if: °· You develop worsening pain that is not relieved by medicine. °· You have: °? A fever or chills. °? Vaginal bleeding. °? Redness and swelling at the incision site. °? Nausea and vomiting. °· You feel dizzy or weak. °· You feel light-headed or you faint. °This information is not intended to replace advice given to you by your health care provider. Make sure you discuss any questions you have with your health care provider. °Document Revised: 07/29/2017 Document Reviewed: 03/17/2016 °Elsevier Patient Education © 2020 [employer] ° ° °Methotrexate Treatment for an Ectopic Pregnancy ° °Methotrexate is a medicine that treats an ectopic pregnancy. An ectopic pregnancy is a pregnancy in which the fetus develops outside the uterus. This kind of pregnancy can be dangerous. Methotrexate works by stopping the growth of the fertilized egg. It also helps your body absorb tissue from the egg. This takes between 2-6 weeks. Most ectopic pregnancies can be successfully treated with methotrexate if they are diagnosed early. °Tell a health care provider about: °· Any allergies you have. °· All medicines you are taking, including vitamins, herbs, eye drops, creams, and over-the-counter medicines. °· Any medical conditions you have. °What are the risks? °Generally, this is   a safe treatment. However, problems may occur, including: °· Nausea or vomiting or both. °· Vaginal bleeding or spotting. °· Diarrhea. °· Abdominal cramping. °· Dizziness or feeling lightheaded. °· Mouth sores. °· Swelling or irritation of the lining of your lungs (pneumonitis). °· Liver damage. °· Hair loss. °There is a risk that methotrexate treatment will fail and your pregnancy will continue. There is also a risk that the ectopic pregnancy might rupture while you are using this medicine. °What happens before the procedure? °· Liver tests, kidney tests, and a complete blood test will be done. °· Blood tests will be done to measure the pregnancy hormone  levels and to determine your blood type. °· If you are Rh-negative and the father is Rh-positive or his Rh type is not known, you will be given a Rho (D) immune globulin shot. °What happens during the procedure? °Your health care provider may give you methotrexate by injection or in the form of a pill. Methotrexate may be given as a single dose of medicine or a series of doses, depending on your response to the treatment. °· Methotrexate injections will be given by your health care provider. This is the most common way that methotrexate is used to treat an ectopic pregnancy. °· If you are prescribed oral methotrexate, it is very important that you follow your health care provider's instructions on how to take oral methotrexate. °Additional medicines may be needed to manage an ectopic pregnancy. °The procedure may vary among health care providers and hospitals. °What happens after the procedure? °· You may have abdominal cramping, vaginal bleeding, and fatigue. °· Blood tests will be taken at timed intervals for several days or weeks to check your pregnancy hormone levels. The blood tests will be done until the pregnancy hormone can no longer be detected in the blood. °· You may need to have a surgical procedure to remove the ectopic pregnancy if methotrexate treatment fails. °· Follow instructions from your health care provider on how and when to report any symptoms that may indicate a ruptured ectopic pregnancy. °Summary °· Methotrexate is a medicine that treats an ectopic pregnancy. °· Methotrexate may be given in a single dose or a series of doses over time. °· Blood tests will be taken at timed intervals for several days or weeks to check your pregnancy hormone levels. The blood tests will be done until no more pregnancy hormone is detected in the blood. °· There is a risk that methotrexate treatment will fail and your pregnancy will continue. There is also a risk that the ectopic pregnancy might rupture while  you are using this medicine. °This information is not intended to replace advice given to you by your health care provider. Make sure you discuss any questions you have with your health care provider. °Document Revised: 07/29/2017 Document Reviewed: 10/05/2016 °Elsevier Patient Education © 2020 Elsevier Inc. ° °

## 2019-10-31 NOTE — MAU Provider Note (Signed)
Chief Complaint: Abdominal Pain   First Provider Initiated Contact with Patient 10/31/19 0027     SUBJECTIVE HPI: Debra Boyd is a 29 y.o. G2P1001 at Unknown who presents to [facility]aternity Admissions reporting abdominal pain. Has a positive pregnancy test 2/22 & started having heavy vaginal bleeding x 1 week. Bleeding is now brown spotting. This evening while at work she had sudden onset of lower abdominal pain, worse in RLQ. Vomited once which improved pain somewhat. She has seen Dr. Nelda Marseille in the office for gyn care but hadn't initiated prenatal care yet because she thought she was having a miscarriage.   Location: abdomen Quality: cramping Severity: 5/10 on pain scale Duration: hours Timing: intermittent Modifying factors: improved after vomiting Associated signs and symptoms: vomited x 1  Past Medical History:  Diagnosis Date  . Bacterial vaginosis   . Chlamydia   . Depression   . Genital HSV   . Gonorrhea   . Ovarian cyst   . Pelvic inflammatory disease   . Trichomonas    OB History  Gravida Para Term Preterm AB Living  '3 1 1   1 1  ' SAB TAB Ectopic Multiple Live Births  1       1    # Outcome Date GA Lbr Len/2nd Weight Sex Delivery Anes PTL Lv  3 Current           2 Term 10/10/09    F CS-LTranv EPI  LIV  1 SAB            Past Surgical History:  Procedure Laterality Date  . CESAREAN SECTION     Social History   Socioeconomic History  . Marital status: Divorced    Spouse name: Not on file  . Number of children: Not on file  . Years of education: Not on file  . Highest education level: Not on file  Occupational History  . Not on file  Tobacco Use  . Smoking status: Former Smoker    Packs/day: 0.50    Types: Cigarettes  . Smokeless tobacco: Never Used  . Tobacco comment: quit 3 years ago  Substance and Sexual Activity  . Alcohol use: Yes    Comment: occasionally  . Drug use: Not Currently    Types: Marijuana    Comment: LAST USED 01-2017  . Sexual  activity: Yes    Birth control/protection: None  Other Topics Concern  . Not on file  Social History Narrative  . Not on file   Social Determinants of Health   Financial Resource Strain:   . Difficulty of Paying Living Expenses: Not on file  Food Insecurity:   . Worried About Charity fundraiser in the Last Year: Not on file  . Ran Out of Food in the Last Year: Not on file  Transportation Needs:   . Lack of Transportation (Medical): Not on file  . Lack of Transportation (Non-Medical): Not on file  Physical Activity:   . Days of Exercise per Week: Not on file  . Minutes of Exercise per Session: Not on file  Stress:   . Feeling of Stress : Not on file  Social Connections:   . Frequency of Communication with Friends and Family: Not on file  . Frequency of Social Gatherings with Friends and Family: Not on file  . Attends Religious Services: Not on file  . Active Member of Clubs or Organizations: Not on file  . Attends Archivist Meetings: Not on file  . Marital Status:  Not on file  Intimate Partner Violence:   . Fear of Current or Ex-Partner: Not on file  . Emotionally Abused: Not on file  . Physically Abused: Not on file  . Sexually Abused: Not on file   Family History  Problem Relation Age of Onset  . Diabetes Maternal Aunt   . Kidney disease Paternal Aunt   . Heart disease Maternal Grandmother   . Hypertension Maternal Grandmother   . Hyperlipidemia Maternal Grandmother   . Lupus Maternal Aunt   . Diabetes Mother    No current facility-administered medications on file prior to encounter.   Current Outpatient Medications on File Prior to Encounter  Medication Sig Dispense Refill  . Prenatal Vit-Fe Fumarate-FA (PRENATAL MULTIVITAMIN) TABS tablet Take 1 tablet by mouth daily at 12 noon.    . Vitamin D, Ergocalciferol, (DRISDOL) 1.25 MG (50000 UNIT) CAPS capsule Take 50,000 Units by mouth every 7 (seven) days.    . valACYclovir (VALTREX) 1000 MG tablet Take  1,000 mg by mouth daily.      No Known Allergies  I have reviewed patient's Past Medical Hx, Surgical Hx, Family Hx, Social Hx, medications and allergies.   Review of Systems  Constitutional: Negative.   Gastrointestinal: Positive for abdominal pain and vomiting. Negative for constipation, diarrhea and nausea.  Genitourinary: Negative.     OBJECTIVE Patient Vitals for the past 24 hrs:  BP Temp Temp src Pulse Resp Height Weight  10/31/19 0313 114/61 -- -- (!) 53 -- -- --  10/30/19 2355 (!) 105/58 98.4 F (36.9 C) Oral (!) 59 20 '5\' 2"'  (1.575 m) 59.1 kg   Constitutional: Well-developed, well-nourished female in no acute distress.  Cardiovascular: normal rate & rhythm, no murmur Respiratory: normal rate and effort. Lung sounds clear throughout GI: TTP in RLQ. Abd soft, Pos BS x 4. No guarding or rebound tenderness MS: Extremities nontender, no edema, normal ROM Neurologic: Alert and oriented x 4.    LAB RESULTS Results for orders placed or performed during the hospital encounter of 10/30/19 (from the past 24 hour(s))  Urinalysis, Routine w reflex microscopic     Status: None   Collection Time: 10/31/19 12:11 AM  Result Value Ref Range   Color, Urine YELLOW YELLOW   APPearance CLEAR CLEAR   Specific Gravity, Urine 1.025 1.005 - 1.030   pH 6.0 5.0 - 8.0   Glucose, UA NEGATIVE NEGATIVE mg/dL   Hgb urine dipstick NEGATIVE NEGATIVE   Bilirubin Urine NEGATIVE NEGATIVE   Ketones, ur NEGATIVE NEGATIVE mg/dL   Protein, ur NEGATIVE NEGATIVE mg/dL   Nitrite NEGATIVE NEGATIVE   Leukocytes,Ua NEGATIVE NEGATIVE  Pregnancy, urine POC     Status: Abnormal   Collection Time: 10/31/19 12:12 AM  Result Value Ref Range   Preg Test, Ur POSITIVE (A) NEGATIVE  CBC     Status: Abnormal   Collection Time: 10/31/19 12:28 AM  Result Value Ref Range   WBC 13.3 (H) 4.0 - 10.5 K/uL   RBC 3.11 (L) 3.87 - 5.11 MIL/uL   Hemoglobin 10.4 (L) 12.0 - 15.0 g/dL   HCT 31.4 (L) 36.0 - 46.0 %   MCV 101.0  (H) 80.0 - 100.0 fL   MCH 33.4 26.0 - 34.0 pg   MCHC 33.1 30.0 - 36.0 g/dL   RDW 11.0 (L) 11.5 - 15.5 %   Platelets 245 150 - 400 K/uL   nRBC 0.0 0.0 - 0.2 %  ABO/Rh     Status: None   Collection Time: 10/31/19 12:28  AM  Result Value Ref Range   ABO/RH(D) O POS    No rh immune globuloin      NOT A RH IMMUNE GLOBULIN CANDIDATE, PT RH POSITIVE Performed at [facility] [address] [place], [place] [address]   hCG, quantitative, pregnancy     Status: Abnormal   Collection Time: 10/31/19 12:28 AM  Result Value Ref Range   hCG, Beta Chain, Quant, S 698 (H) <5 mIU/mL  Comprehensive metabolic panel     Status: Abnormal   Collection Time: 10/31/19 12:28 AM  Result Value Ref Range   Sodium 135 135 - 145 mmol/L   Potassium 3.5 3.5 - 5.1 mmol/L   Chloride 102 98 - 111 mmol/L   CO2 26 22 - 32 mmol/L   Glucose, Bld 119 (H) 70 - 99 mg/dL   BUN 11 6 - 20 mg/dL   Creatinine, Ser 0.70 0.44 - 1.00 mg/dL   Calcium 8.8 (L) 8.9 - 10.3 mg/dL   Total Protein 6.6 6.5 - 8.1 g/dL   Albumin 3.6 3.5 - 5.0 g/dL   AST 18 15 - 41 U/L   ALT 14 0 - 44 U/L   Alkaline Phosphatase 59 38 - 126 U/L   Total Bilirubin 0.8 0.3 - 1.2 mg/dL   GFR calc non Af Amer >60 >60 mL/min   GFR calc Af Amer >60 >60 mL/min   Anion gap 7 5 - 15    IMAGING US OB LESS THAN 14 WEEKS WITH OB TRANSVAGINAL  Result Date: 10/31/2019 CLINICAL DATA:  Pain and bleeding EXAM: OBSTETRIC <14 WK [place] AND TRANSVAGINAL OB US TECHNIQUE: Both transabdominal and transvaginal ultrasound examinations were performed for complete evaluation of the gestation as well as the maternal uterus, adnexal regions, and pelvic cul-de-sac. Transvaginal technique was performed to assess early pregnancy. COMPARISON:  None. FINDINGS: Intrauterine gestational sac: None Yolk sac:  Not Visualized. Subchorionic hemorrhage:  None visualized. Maternal uterus/adnexae: Within the right adnexa adjacent to the ovary is a heterogeneous cystic mass with a ring of  vascular flow. The mass appears to measure 2.1 x 1.4 x 2.0 cm. There are small cystic/fluid-filled spaces seen adjacent to this mass, which appears to be between the ovary and uterus. No free fluid is seen adjacent to this area. The right ovary measures 2.8 x 1.2 x 2.3 cm. The left ovary measures 3.0 x 2.2 x 2.2 cm. IMPRESSION: Right adnexal vascular mass which appears separate from the ovary measuring 2.1 x 1.4 x 2.0 cm. These findings are concerning for ectopic pregnancy either within the fallopian tube or adjacent to the ovary. Please correlate the patient's beta HCG. Critical Value/emergent results were called by telephone at the time of interpretation on 10/31/2019 at 1:16 am to provider Jorje Guild , who verbally acknowledged these results. Electronically Signed   By: Prudencio Pair M.D.   On: 10/31/2019 01:26    MAU COURSE Orders Placed This Encounter  Procedures  . US OB LESS THAN 14 WEEKS WITH OB TRANSVAGINAL  . Urinalysis, Routine w reflex microscopic  . CBC  . hCG, quantitative, pregnancy  . Comprehensive metabolic panel  . Pregnancy, urine POC  . ABO/Rh  . Discharge patient   Meds ordered this encounter  Medications  . DISCONTD: methotrexate (EMGYN) chemo injection kit 80 mg    MDM +UPT UA, wet prep, GC/chlamydia, CBC, ABO/Rh, quant hCG, and [place] today to rule out ectopic pregnancy which can be life threatening.   RH positive  Ultrasound shows right  adnexal mass concerning for ectopic pregnancy Discussed results with patient who is appropriately upset. Counseled regarding methotrexate therapy, side effects, & follow up, as well as risks of not treating her ectopic. Initially she is agreeable to MTX but when the nurse went to administer it, she declined.  Per the patient she has been told that she has a less than 5% chance of conceiving due to low egg count and she doesn't feel comfortable taking methotrexate. She states she would like to speak with Dr. Nelda Marseille regarding her treatment  options and would like to go home now.  Discussed patient with Dr. Nehemiah Settle. She is stable and seems reliable. Will discharge her home with strict return precautions & I will speak with Dr. Nelda Marseille later this morning.   ASSESSMENT 1. Right tubal pregnancy without intrauterine pregnancy   2. Abdominal pain during pregnancy in first trimester     PLAN Discharge home in stable condition. Return immediately for worsening symptoms Will notify Dr. Nelda Marseille of patient's diagnosis so she can reach out to patient later this morning for treatment  Follow-up Information    Janyth Pupa, DO Follow up.   Specialty: Obstetrics and Gynecology Contact information: [address] [address] [phone]        [facility] Follow up.   Specialty: Obstetrics and Gynecology Why: return immediately for worsening symptoms Contact information: [address] [id number] [place] [phone]         Allergies as of 10/31/2019   No Known Allergies     Medication List    STOP taking these medications   clindamycin 300 MG capsule Commonly known as: Cleocin   naproxen 500 MG tablet Commonly known as: NAPROSYN     TAKE these medications   prenatal multivitamin Tabs tablet Take 1 tablet by mouth daily at 12 noon.   valACYclovir 1000 MG tablet Commonly known as: VALTREX Take 1,000 mg by mouth daily.   Vitamin D (Ergocalciferol) 1.25 MG (50000 UNIT) Caps capsule Commonly known as: DRISDOL Take 50,000 Units by mouth every 7 (seven) days.        Jorje Guild, NP 10/31/2019  7:23 AM

## 2019-11-03 ENCOUNTER — Other Ambulatory Visit: Payer: Self-pay

## 2019-11-03 ENCOUNTER — Inpatient Hospital Stay (HOSPITAL_COMMUNITY): Payer: No Typology Code available for payment source

## 2019-11-03 ENCOUNTER — Inpatient Hospital Stay (HOSPITAL_COMMUNITY)
Admission: AD | Admit: 2019-11-03 | Discharge: 2019-11-03 | Disposition: A | Discharge status: Home / Self Care | Payer: No Typology Code available for payment source | Attending: Obstetrics and Gynecology | Admitting: Obstetrics and Gynecology

## 2019-11-03 DIAGNOSIS — O00101 Right tubal pregnancy without intrauterine pregnancy: Secondary | ICD-10-CM

## 2019-11-03 LAB — HCG, QUANTITATIVE, PREGNANCY: hCG, Beta Chain, Quant, S: 2413 m[IU]/mL — ABNORMAL HIGH (ref <5)

## 2019-11-03 LAB — CBC WITH DIFFERENTIAL/PLATELET
Abs Immature Granulocytes: 0.01 10*3/uL (ref 0.00–0.07)
Basophils Absolute: 0 10*3/uL (ref 0.0–0.1)
Basophils Relative: 0 %
Eosinophils Absolute: 0.1 10*3/uL (ref 0.0–0.5)
Eosinophils Relative: 2 %
HCT: 33.3 % — ABNORMAL LOW (ref 36.0–46.0)
Hemoglobin: 11.1 g/dL — ABNORMAL LOW (ref 12.0–15.0)
Immature Granulocytes: 0 %
Lymphocytes Relative: 31 %
Lymphs Abs: 1.9 10*3/uL (ref 0.7–4.0)
MCH: 33.1 pg (ref 26.0–34.0)
MCHC: 33.3 g/dL (ref 30.0–36.0)
MCV: 99.4 fL (ref 80.0–100.0)
Monocytes Absolute: 0.5 10*3/uL (ref 0.1–1.0)
Monocytes Relative: 9 %
Neutro Abs: 3.5 10*3/uL (ref 1.7–7.7)
Neutrophils Relative %: 58 %
Platelets: 233 10*3/uL (ref 150–400)
RBC: 3.35 MIL/uL — ABNORMAL LOW (ref 3.87–5.11)
RDW: 10.7 % — ABNORMAL LOW (ref 11.5–15.5)
WBC: 6.1 10*3/uL (ref 4.0–10.5)
nRBC: 0 % (ref 0.0–0.2)

## 2019-11-03 LAB — COMPREHENSIVE METABOLIC PANEL
ALT: 20 U/L (ref 0–44)
AST: 20 U/L (ref 15–41)
Albumin: 3.5 g/dL (ref 3.5–5.0)
Alkaline Phosphatase: 74 U/L (ref 38–126)
Anion gap: 6 (ref 5–15)
BUN: 13 mg/dL (ref 6–20)
CO2: 25 mmol/L (ref 22–32)
Calcium: 8.8 mg/dL — ABNORMAL LOW (ref 8.9–10.3)
Chloride: 104 mmol/L (ref 98–111)
Creatinine, Ser: 0.69 mg/dL (ref 0.44–1.00)
GFR calc Af Amer: >60 mL/min (ref >60)
GFR calc non Af Amer: >60 mL/min (ref >60)
Glucose, Bld: 105 mg/dL — ABNORMAL HIGH (ref 70–99)
Potassium: 3.9 mmol/L (ref 3.5–5.1)
Sodium: 135 mmol/L (ref 135–145)
Total Bilirubin: 1.1 mg/dL (ref 0.3–1.2)
Total Protein: 6.5 g/dL (ref 6.5–8.1)

## 2019-11-03 MED ORDER — METHOTREXATE FOR ECTOPIC PREGNANCY
50.0000 mg/m2 | Freq: Once | INTRAMUSCULAR | Status: AC
  Administered 2019-11-03: 80 mg via INTRAMUSCULAR
  Filled 2019-11-03: qty 1

## 2019-11-03 NOTE — Discharge Instructions (Signed)
Methotrexate Treatment for an Ectopic Pregnancy, Care After This sheet gives you information about how to care for yourself after your procedure. Your health care provider may also give you more specific instructions. If you have problems or questions, contact your health care provider. What can I expect after the procedure? After the procedure, it is common to have:  Abdominal cramping.  Vaginal bleeding.  Fatigue.  Nausea.  Vomiting.  Diarrhea. Blood tests will be taken at timed intervals for several days or weeks to check your pregnancy hormone levels. The blood tests will be done until the pregnancy hormone can no longer be detected in the blood. Follow these instructions at home: Activity  Do not have sex until your health care provider approves.  Limit activities that take a lot of effort as told by your health care provider. Medicines  Take over the counter and prescription medicines only as told by your health care provider.  Do not take aspirin, ibuprofen, naproxen, or any other NSAIDs.  Do not take folic acid, prenatal vitamins, or other vitamins that contain folic acid. General instructions   Do not drink alcohol.  Follow instructions from your health care provider on how and when to report any symptoms that may indicate a ruptured ectopic pregnancy.  Keep all follow-up visits as told by your health care provider. This is important. Contact a health care provider if:  You have persistent nausea and vomiting.  You have persistent diarrhea.  You are having a reaction to the medicine, such as: ? Tiredness. ? Skin rash. ? Hair loss. Get help right away if:  Your abdominal or pelvic pain gets worse.  You have more vaginal bleeding.  You feel light-headed or you faint.  You have shortness of breath.  Your heart rate increases.  You develop a cough.  You have chills.  You have a fever. Summary  After the procedure, it is common to have symptoms  of abdominal cramping, vaginal bleeding and fatigue. You may also experience other symptoms.  Blood tests will be taken at timed intervals for several days or weeks to check your pregnancy hormone levels. The blood tests will be done until the pregnancy hormone can no longer be detected in the blood.  Limit strenuous activity as told by your health care provider.  Follow instructions from your health care provider on how and when to report any symptoms that may indicate a ruptured ectopic pregnancy. This information is not intended to replace advice given to you by your health care provider. Make sure you discuss any questions you have with your health care provider. Document Revised: 07/29/2017 Document Reviewed: 10/05/2016 Elsevier Patient Education  2020 [employer]   Ectopic Pregnancy  An ectopic pregnancy is when the fertilized egg attaches (implants) outside the uterus. Most ectopic pregnancies occur in one of the tubes where eggs travel from the ovary to the uterus (fallopian tubes), but the implanting can occur in other locations. In rare cases, ectopic pregnancies occur on the ovary, intestine, pelvis, abdomen, or cervix. In an ectopic pregnancy, the fertilized egg does not have the ability to develop into a normal, healthy baby. A ruptured ectopic pregnancy is one in which tearing or bursting of a fallopian tube causes internal bleeding. Often, there is intense lower abdominal pain, and vaginal bleeding sometimes occurs. Having an ectopic pregnancy can be life-threatening. If this dangerous condition is not treated, it can lead to blood loss, shock, or even death. What are the causes? The most common cause  The most common cause of this condition is damage to one of the fallopian tubes. A fallopian tube may be narrowed or blocked, and that keeps the fertilized egg from reaching the uterus. What increases the risk? This condition is more likely to develop in women of childbearing age who have  different levels of risk. The levels of risk can be divided into three categories. High risk  You have gone through infertility treatment.  You have had an ectopic pregnancy before.  You have had surgery on the fallopian tubes, or another surgical procedure, such as an abortion.  You have had surgery to have the fallopian tubes tied (tubal ligation).  You have problems or diseases of the fallopian tubes.  You have been exposed to diethylstilbestrol (DES). This medicine was used until 1971, and it had effects on babies whose mothers took the medicine.  You become pregnant while using an IUD (intrauterine device) for birth control. Moderate risk  You have a history of infertility.  You have had an STI (sexually transmitted infection).  You have a history of pelvic inflammatory disease (PID).  You have scarring from endometriosis.  You have multiple sexual partners.  You smoke. Low risk  You have had pelvic surgery.  You use vaginal douches.  You became sexually active before age 18. What are the signs or symptoms? Common symptoms of this condition include normal pregnancy symptoms, such as missing a period, nausea, tiredness, abdominal pain, breast tenderness, and bleeding. However, ectopic pregnancy will have additional symptoms, such as:  Pain with intercourse.  Irregular vaginal bleeding or spotting.  Cramping or pain on one side or in the lower abdomen.  Fast heartbeat, low blood pressure, and sweating.  Passing out while having a bowel movement. Symptoms of a ruptured ectopic pregnancy and internal bleeding may include:  Sudden, severe pain in the abdomen and pelvis.  Dizziness, weakness, light-headedness, or fainting.  Pain in the shoulder or neck area. How is this diagnosed? This condition is diagnosed by:  A pelvic exam to locate pain or a mass in the abdomen.  A pregnancy test. This blood test checks for the presence as well as the specific level of  pregnancy hormone in the bloodstream.  Ultrasound. This is performed if a pregnancy test is positive. In this test, a probe is inserted into the vagina. The probe will detect a fetus, possibly in a location other than the uterus.  Taking a sample of uterus tissue (dilation and curettage, or D&C).  Surgery to perform a visual exam of the inside of the abdomen using a thin, lighted tube that has a tiny camera on the end (laparoscope).  Culdocentesis. This procedure involves inserting a needle at the top of the vagina, behind the uterus. If blood is present in this area, it may indicate that a fallopian tube is torn. How is this treated? This condition is treated with medicine or surgery. Medicine  An injection of a medicine (methotrexate) may be given to cause the pregnancy tissue to be absorbed. This medicine may save your fallopian tube. It may be given if: ? The diagnosis is made early, with no signs of active bleeding. ? The fallopian tube has not ruptured. ? You are considered to be a good candidate for the medicine. Usually, pregnancy hormone blood levels are checked after methotrexate treatment. This is to be sure that the medicine is effective. It may take 4-6 weeks for the pregnancy to be absorbed. Most pregnancies will be absorbed by 3 weeks.   Surgery  A laparoscope may be used to remove the pregnancy tissue.  If severe internal bleeding occurs, a larger cut (incision) may be made in the lower abdomen (laparotomy) to remove the fetus and placenta. This is done to stop the bleeding.  Part or all of the fallopian tube may be removed (salpingectomy) along with the fetus and placenta. The fallopian tube may also be repaired during the surgery.  In very rare circumstances, removal of the uterus (hysterectomy) may be required.  After surgery, pregnancy hormone testing may be done to be sure that there is no pregnancy tissue left. Whether your treatment is medicine or surgery, you may  receive a Rho (D) immune globulin shot to prevent problems with any future pregnancy. This shot may be given if:  You are Rh-negative and the baby's father is Rh-positive.  You are Rh-negative and you do not know the Rh type of the baby's father. Follow these instructions at home:  Rest and limit your activity after the procedure for as long as told by your health care provider.  Until your health care provider says that it is safe: ? Do not lift anything that is heavier than 10 lb (4.5 kg), or the limit that your health care provider tells you. ? Avoid physical exercise and any movement that requires effort (is strenuous).  To help prevent constipation: ? Eat a healthy diet that includes fruits, vegetables, and whole grains. ? Drink 6-8 glasses of water per day. Get help right away if:  You develop worsening pain that is not relieved by medicine.  You have: ? A fever or chills. ? Vaginal bleeding. ? Redness and swelling at the incision site. ? Nausea and vomiting.  You feel dizzy or weak.  You feel light-headed or you faint. This information is not intended to replace advice given to you by your health care provider. Make sure you discuss any questions you have with your health care provider. Document Revised: 07/29/2017 Document Reviewed: 03/17/2016 Elsevier Patient Education  2020 [employer]  

## 2019-11-03 NOTE — MAU Note (Signed)
Debra Boyd is a 29 y.o. at Unknown here in MAU reporting:  For repeat blood work.  Denies vaginal bleeding. Reports intermittent lower abdominal cramping.  Onset of complaint: midnight Pain score: 1-2/10 "on and off" Vitals:   11/03/19 0956  BP: 107/64  Pulse: 60  Resp: 16  Temp: 99.2 F (37.3 C)  SpO2: 100%     Lab orders placed from triage: none

## 2019-11-03 NOTE — MAU Provider Note (Signed)
History   Chief Complaint:  Follow-up   Debra Boyd is  29 y.o. G3P1011 No LMP recorded. Patient is pregnant.. Patient is here for follow up of quantitative HCG and ongoing surveillance of pregnancy status. She has a known right ectopic pregnancy for which she received Methotrexate on 3/3  Since her last visit, the patient is with new complaint. The patient reports bleeding as  none now.  She reports intermittent lower abdominal pain on the right side that she rates a 2/10.   General ROS:  positive for abdominal pain  Her previous Quantitative HCG values are: Results for JANEI, SCHEFF (MRN [id number]) as of 11/03/2019 14:33  Ref. Range 10/31/2019 00:28  HCG, Beta Chain, Quant, S Latest Ref Range: <5 mIU/mL 698 (H)   Physical Exam   Blood pressure 107/64, pulse 60, temperature 99.2 F (37.3 C), temperature source Oral, resp. rate 16, SpO2 100 %.  Focused Gynecological Exam: examination not indicated  Labs: Results for orders placed or performed during the hospital encounter of 11/03/19 (from the past 24 hour(s))  hCG, quantitative, pregnancy   Collection Time: 11/03/19 10:03 AM  Result Value Ref Range   hCG, Beta Chain, Quant, S 2,413 (H) <5 mIU/mL    Ultrasound Studies:   US OB Transvaginal  Result Date: 11/03/2019 CLINICAL DATA:  Pelvic pain.  Right ectopic pregnancy. EXAM: TRANSVAGINAL OB ULTRASOUND TECHNIQUE: Transvaginal ultrasound was performed for complete evaluation of the gestation as well as the maternal uterus, adnexal regions, and pelvic cul-de-sac. COMPARISON:  10/31/2019. FINDINGS: Intrauterine gestational sac: Not visualized Yolk sac:  Not visualized Embryo:  Not visualized Cardiac Activity: Not visualized Maternal uterus/adnexae: Again demonstrated is a cystic and solid right adnexal mass with surrounding ring of vascularity with color Doppler. No internal blood flow is visualized. This mass measures approximately 1.9 x 1.6 x 1.4 cm today, previously  approximately 2.1 x 2.0 x 1.4 cm. Normal appearing maternal ovaries with small follicles. Trace amount of free peritoneal fluid adjacent to the right adnexal mass. IMPRESSION: 1. Interval slight decrease in size in the previously demonstrated probable right adnexal ectopic pregnancy. 2. Interval trace amount of free peritoneal fluid adjacent to the probable right adnexal ectopic pregnancy. This could represent reactive fluid. Fluid associated with rupture of the ectopic pregnancy is less likely based on the fact that this appears to represent simple fluid rather than blood. Electronically Signed   By: Claudie Revering M.D.   On: 11/03/2019 12:57   US OB LESS THAN 14 WEEKS WITH OB TRANSVAGINAL  Result Date: 10/31/2019 CLINICAL DATA:  Pain and bleeding EXAM: OBSTETRIC <14 WK [place] AND TRANSVAGINAL OB US TECHNIQUE: Both transabdominal and transvaginal ultrasound examinations were performed for complete evaluation of the gestation as well as the maternal uterus, adnexal regions, and pelvic cul-de-sac. Transvaginal technique was performed to assess early pregnancy. COMPARISON:  None. FINDINGS: Intrauterine gestational sac: None Yolk sac:  Not Visualized. Subchorionic hemorrhage:  None visualized. Maternal uterus/adnexae: Within the right adnexa adjacent to the ovary is a heterogeneous cystic mass with a ring of vascular flow. The mass appears to measure 2.1 x 1.4 x 2.0 cm. There are small cystic/fluid-filled spaces seen adjacent to this mass, which appears to be between the ovary and uterus. No free fluid is seen adjacent to this area. The right ovary measures 2.8 x 1.2 x 2.3 cm. The left ovary measures 3.0 x 2.2 x 2.2 cm. IMPRESSION: Right adnexal vascular mass which appears separate from the ovary measuring 2.1 x 1.4 x  2.0 cm. These findings are concerning for ectopic pregnancy either within the fallopian tube or adjacent to the ovary. Please correlate the patient's beta HCG. Critical Value/emergent results were called by  telephone at the time of interpretation on 10/31/2019 at 1:16 am to provider Judeth Horn , who verbally acknowledged these results. Electronically Signed   By: Jonna Clark M.D.   On: 10/31/2019 01:26    Consulted with Dr. Shawnie Pons regarding increase in HCG and new free fluid on u/s. Recommends giving second dose of MTX today and consulting with Dr. Dion Body.   Consulted Dr. Dion Body who agrees and recommends second dose of MTX today.   Discussed plan of care at length with patient. Patient agreeable to second dose of MTX. Labs stable from 3/3. Discussed ectopic precautions at length with patient and follow up lab schedule.  Assessment:   1. Right tubal pregnancy without intrauterine pregnancy       Plan: -Discharge home in stable condition -Strict precautions discussed -Patient advised to follow-up with MAU on 3/9 for repeat HCG -Patient may return to MAU as needed or if her condition were to change or worsen  Rolm Bookbinder, CNM 11/03/2019, 1:29 PM

## 2019-11-06 ENCOUNTER — Inpatient Hospital Stay (HOSPITAL_COMMUNITY)
Admission: AD | Admit: 2019-11-06 | Discharge: 2019-11-06 | Disposition: A | Discharge status: Home / Self Care | Payer: No Typology Code available for payment source | Attending: Obstetrics & Gynecology | Admitting: Obstetrics & Gynecology

## 2019-11-06 ENCOUNTER — Other Ambulatory Visit: Payer: Self-pay

## 2019-11-06 DIAGNOSIS — Z3A01 Less than 8 weeks gestation of pregnancy: Secondary | ICD-10-CM | POA: Diagnosis not present

## 2019-11-06 DIAGNOSIS — O00109 Unspecified tubal pregnancy without intrauterine pregnancy: Secondary | ICD-10-CM | POA: Diagnosis not present

## 2019-11-06 DIAGNOSIS — O00201 Right ovarian pregnancy without intrauterine pregnancy: Secondary | ICD-10-CM | POA: Diagnosis not present

## 2019-11-06 LAB — HCG, QUANTITATIVE, PREGNANCY: hCG, Beta Chain, Quant, S: 2284 m[IU]/mL — ABNORMAL HIGH (ref <5)

## 2019-11-06 NOTE — MAU Provider Note (Signed)
Patient Debra Boyd is a 29 y.o. G3P1011 at 6 weeks 2 days by LMP here for follow up. She was diagnosed with pregnancy of unknown location on 3/3 with a beta HCG of 698. She  received dose of methotrexate on that day. US showed suspicious mass adjacent to right ovary.   Her follow up bhcg in [place] on 3/6 showed 2,413.  Patient on that day described lower abdominal pain but bleeding had stopped. Given change in symptoms and large increase in quant, patient was given second dose of MTX and told to return for follow-up lab on 3/9 (Day # 4). Repeat US that day (3/6) showed that mass was shrinking.     Beta today is 2,284 (Day # 4 from Day#1 dose on 11/03/2019).  Pain today is lessened, but it feels "stabbing". It is a 4/10, although she described it as less than on Saturday.  She only feels it when she sits.   Patient is very upset about ectopic and desires to preserve her fertility. She does not want surgery. She has been seeing Dr. Charlotta Newton for infertility and Dr. Charlotta Newton is aware of her presentation in MAU.   Based on beta HCG, she had a small drop in quant from 2,413 to 2,284 (drop of 129 points). Given patient's desire to preserve fertility, would recommend follow up Day # 7 labs. Updated Dr. Charlotta Newton on findings and patient's desire for preservation of fallopian tubes.   Patient and Dr. Charlotta Newton spoke on phone; then Dr. Charlotta Newton and I spoke. Per patient and Dr. Charlotta Newton, continue watchful waiting. Patient will return for Day # 7 labs on Friday, March 12. She was given strict return precautions and told to come to MAU after her shift is over Friday morning. She should have nothing to eat or drink after midnight on 3/12 in case she needs surgery Friday morning.   Patient verbalized understanding; all questions answered.   Charlesetta Garibaldi Saulo Anthis 11/06/2019, 2:12 PM

## 2019-11-06 NOTE — MAU Note (Signed)
Presents for f/u Hcg level, but also now having pelvic pain.  Denies VB.

## 2019-11-09 ENCOUNTER — Inpatient Hospital Stay (HOSPITAL_COMMUNITY)
Admission: AD | Admit: 2019-11-09 | Discharge: 2019-11-09 | Disposition: A | Discharge status: Home / Self Care | Payer: No Typology Code available for payment source | Attending: Obstetrics & Gynecology | Admitting: Obstetrics & Gynecology

## 2019-11-09 ENCOUNTER — Other Ambulatory Visit: Payer: Self-pay

## 2019-11-09 DIAGNOSIS — Z87891 Personal history of nicotine dependence: Secondary | ICD-10-CM | POA: Insufficient documentation

## 2019-11-09 DIAGNOSIS — Z3A01 Less than 8 weeks gestation of pregnancy: Secondary | ICD-10-CM | POA: Diagnosis not present

## 2019-11-09 DIAGNOSIS — O009 Unspecified ectopic pregnancy without intrauterine pregnancy: Secondary | ICD-10-CM | POA: Insufficient documentation

## 2019-11-09 DIAGNOSIS — O00101 Right tubal pregnancy without intrauterine pregnancy: Secondary | ICD-10-CM

## 2019-11-09 LAB — HCG, QUANTITATIVE, PREGNANCY: hCG, Beta Chain, Quant, S: 1546 m[IU]/mL — ABNORMAL HIGH (ref <5)

## 2019-11-09 NOTE — MAU Note (Signed)
Pt here for follow up hcg after MTX. Pt denies any changes to pain. No bleeding.

## 2019-11-09 NOTE — Discharge Instructions (Signed)
Methotrexate Treatment for an Ectopic Pregnancy, Care After This sheet gives you information about how to care for yourself after your procedure. Your health care provider may also give you more specific instructions. If you have problems or questions, contact your health care provider. What can I expect after the procedure? After the procedure, it is common to have:  Abdominal cramping.  Vaginal bleeding.  Fatigue.  Nausea.  Vomiting.  Diarrhea. Blood tests will be taken at timed intervals for several days or weeks to check your pregnancy hormone levels. The blood tests will be done until the pregnancy hormone can no longer be detected in the blood. Follow these instructions at home: Activity  Do not have sex until your health care provider approves.  Limit activities that take a lot of effort as told by your health care provider. Medicines  Take over the counter and prescription medicines only as told by your health care provider.  Do not take aspirin, ibuprofen, naproxen, or any other NSAIDs.  Do not take folic acid, prenatal vitamins, or other vitamins that contain folic acid. General instructions   Do not drink alcohol.  Follow instructions from your health care provider on how and when to report any symptoms that may indicate a ruptured ectopic pregnancy.  Keep all follow-up visits as told by your health care provider. This is important. Contact a health care provider if:  You have persistent nausea and vomiting.  You have persistent diarrhea.  You are having a reaction to the medicine, such as: ? Tiredness. ? Skin rash. ? Hair loss. Get help right away if:  Your abdominal or pelvic pain gets worse.  You have more vaginal bleeding.  You feel light-headed or you faint.  You have shortness of breath.  Your heart rate increases.  You develop a cough.  You have chills.  You have a fever. Summary  After the procedure, it is common to have symptoms  of abdominal cramping, vaginal bleeding and fatigue. You may also experience other symptoms.  Blood tests will be taken at timed intervals for several days or weeks to check your pregnancy hormone levels. The blood tests will be done until the pregnancy hormone can no longer be detected in the blood.  Limit strenuous activity as told by your health care provider.  Follow instructions from your health care provider on how and when to report any symptoms that may indicate a ruptured ectopic pregnancy. This information is not intended to replace advice given to you by your health care provider. Make sure you discuss any questions you have with your health care provider. Document Revised: 07/29/2017 Document Reviewed: 10/05/2016 Elsevier Patient Education  2020 [employer]  

## 2019-11-09 NOTE — MAU Provider Note (Signed)
Chief Complaint: Labs Only     SUBJECTIVE HPI: Debra Boyd is a 29 y.o. G3P1011 at [redacted]w[redacted]d by LMP who presents to maternity admissions reporting needing Day#7 post Methotrexate labs.  States still has dull pain in pelvis, no increase in pain.  . She denies vaginal bleeding, vaginal itching/burning, urinary symptoms, h/a, dizziness, n/v, or fever/chills.    She was diagnosed with pregnancy of unknown location on 3/3 with a beta HCG of 698. She  received dose of methotrexate on that day. US showed suspicious mass adjacent to right ovary.   Her follow up bhcg in [place] on 3/6 showed 2,413.  Patient on that day described lower abdominal pain but bleeding had stopped. Given change in symptoms and large increase in quant, patient was given second dose of MTX and told to return for follow-up lab on 3/9 (Day # 4). Repeat US that day (3/6) showed that mass was shrinking.  Day#4 HCG was 2284.    Abdominal Pain This is a recurrent problem. The current episode started in the past 7 days. The onset quality is gradual. The problem occurs constantly. The problem has been unchanged. The quality of the pain is dull. The abdominal pain does not radiate. Pertinent negatives include no constipation, diarrhea, dysuria, fever, nausea or vomiting.    Past Medical History:  Diagnosis Date  . Bacterial vaginosis   . Chlamydia   . Depression   . Genital HSV   . Gonorrhea   . Ovarian cyst   . Pelvic inflammatory disease   . Trichomonas    Past Surgical History:  Procedure Laterality Date  . CESAREAN SECTION     Social History   Socioeconomic History  . Marital status: Divorced    Spouse name: Not on file  . Number of children: Not on file  . Years of education: Not on file  . Highest education level: Not on file  Occupational History  . Not on file  Tobacco Use  . Smoking status: Former Smoker    Packs/day: 0.50    Types: Cigarettes  . Smokeless tobacco: Never Used  . Tobacco comment:  quit 3 years ago  Substance and Sexual Activity  . Alcohol use: Yes    Comment: occasionally  . Drug use: Not Currently    Types: Marijuana    Comment: LAST USED 01-2017  . Sexual activity: Yes    Birth control/protection: None  Other Topics Concern  . Not on file  Social History Narrative  . Not on file   Social Determinants of Health   Financial Resource Strain:   . Difficulty of Paying Living Expenses:   Food Insecurity:   . Worried About Programme researcher, broadcasting/film/video in the Last Year:   . Barista in the Last Year:   Transportation Needs:   . Freight forwarder (Medical):   Marland Kitchen Lack of Transportation (Non-Medical):   Physical Activity:   . Days of Exercise per Week:   . Minutes of Exercise per Session:   Stress:   . Feeling of Stress :   Social Connections:   . Frequency of Communication with Friends and Family:   . Frequency of Social Gatherings with Friends and Family:   . Attends Religious Services:   . Active Member of Clubs or Organizations:   . Attends Banker Meetings:   Marland Kitchen Marital Status:   Intimate Partner Violence:   . Fear of Current or Ex-Partner:   . Emotionally Abused:   .  Physically Abused:   . Sexually Abused:    No current facility-administered medications on file prior to encounter.   Current Outpatient Medications on File Prior to Encounter  Medication Sig Dispense Refill  . Prenatal Vit-Fe Fumarate-FA (PRENATAL MULTIVITAMIN) TABS tablet Take 1 tablet by mouth daily at 12 noon.    . valACYclovir (VALTREX) 1000 MG tablet Take 1,000 mg by mouth daily.     . Vitamin D, Ergocalciferol, (DRISDOL) 1.25 MG (50000 UNIT) CAPS capsule Take 50,000 Units by mouth every 7 (seven) days.     No Known Allergies  I have reviewed patient's Past Medical Hx, Surgical Hx, Family Hx, Social Hx, medications and allergies.   ROS:  Review of Systems  Constitutional: Negative for fever.  Gastrointestinal: Positive for abdominal pain. Negative for  constipation, diarrhea, nausea and vomiting.  Genitourinary: Negative for dysuria.   Review of Systems  Other systems negative   Physical Exam  Physical Exam Patient Vitals for the past 24 hrs:  BP Temp Temp src Pulse Resp SpO2  11/09/19 0616 (!) 105/58 98.5 F (36.9 C) Oral 77 16 100 %   Constitutional: Well-developed, well-nourished female in no acute distress.  Cardiovascular: normal rate Respiratory: normal effort GI: Abd soft, non-tender. Pos BS x 4 MS: Extremities nontender, no edema, normal ROM Neurologic: Alert and oriented x 4.  GU: Neg CVAT.  PELVIC EXAM: Deferred  LAB RESULTS hCG, Beta Chain, Quant, S <5 mIU/mL 1,546High     PRIOR RESULTS  Ref. Range 11/06/2019 10:14  HCG, Beta Chain, Quant, S Latest Ref Range: <5 mIU/mL 2,284 (H)    Ref. Range 11/03/2019 10:03  HCG, Beta Chain, Quant, S Latest Ref Range: <5 mIU/mL 2,413 (H)   --/--/O POS (03/03 0028)  IMAGING   MAU Management/MDM: Ordered HCG level (will be Day#7 post second dose of Methotrexate Reviewed results with patient which reflect a drop in HCG level  ASSESSMENT Right Ectopic pregnancy at [redacted]w[redacted]d by LMP S/P Methotrexate x 2 doses  PLAN Recommend followup in office Patient to call office and arrange followup labs per Dr Nelda Marseille Ectopic precautions  Hansel Feinstein CNM, MSN Certified Nurse-Midwife 11/09/2019  6:46 AM

## 2020-01-30 ENCOUNTER — Inpatient Hospital Stay (HOSPITAL_COMMUNITY)
Admission: AD | Admit: 2020-01-30 | Discharge: 2020-01-30 | Disposition: A | Discharge status: Home / Self Care | Payer: Self-pay | Attending: Obstetrics and Gynecology | Admitting: Obstetrics and Gynecology

## 2020-01-30 ENCOUNTER — Other Ambulatory Visit: Payer: Self-pay

## 2020-01-30 DIAGNOSIS — N941 Unspecified dyspareunia: Secondary | ICD-10-CM | POA: Insufficient documentation

## 2020-01-30 DIAGNOSIS — Z3202 Encounter for pregnancy test, result negative: Secondary | ICD-10-CM

## 2020-01-30 DIAGNOSIS — N939 Abnormal uterine and vaginal bleeding, unspecified: Secondary | ICD-10-CM | POA: Insufficient documentation

## 2020-01-30 LAB — POCT PREGNANCY, URINE: Preg Test, Ur: NEGATIVE

## 2020-01-30 NOTE — MAU Provider Note (Signed)
None     S [name] is a 29 y.o. G31P1011 non-pregnant female who presents to MAU today with complaint of vaginal bleeding x 1 episode and abdominal pain x 1 separate episode with intercourse. Pt has recent hx ectopic pregnancy and was concerned because pain was similar.  She took 2 UPTs at home which were both negative.   O There were no vitals taken for this visit. Physical Exam Nursing note reviewed,  Constitutional: well developed, well nourished, no distress HEENT: normocephalic CV: normal rate Pulm/chest wall: normal effort Abdomen: soft Neuro: alert and oriented x 3 Skin: warm, dry Psych: affect normal  A Non pregnant female Medical screening exam complete Dyspareunia   P Discharge from MAU in stable condition Patient given the option of transfer to [facility] for further evaluation or seek care in outpatient facility of choice List of options for follow-up given  Warning signs for worsening condition that would warrant emergency follow-up discussed Patient may return to MAU as needed for pregnancy related complaints  Hurshel Party, CNM 01/30/2020 5:30 PM

## 2020-01-30 NOTE — MAU Note (Signed)
Pt stated she had some bleeding after intercourse (about 2 weeks after her LMP). Thought she might be having implantation bleeding. Then she was having intercourse again today and had a sharp pain that felt like it did when she was having the ectopic pregnancy. Pt took HPT and it was negative. Pt also having pain after urination. MAU pregnancy test is negative as well.

## 2020-11-15 IMAGING — DX DG WRIST COMPLETE 3+V*R*
1 series · 4 of 4 positions shown · non-contrast
Comparison: None.

CLINICAL DATA: Wrist pain after a fall.

EXAM:
RIGHT WRIST - COMPLETE 3+ VIEW

[Series 1: wrist · 0.14mm/px · 4 of 4 slices shown]
[im 1/4]
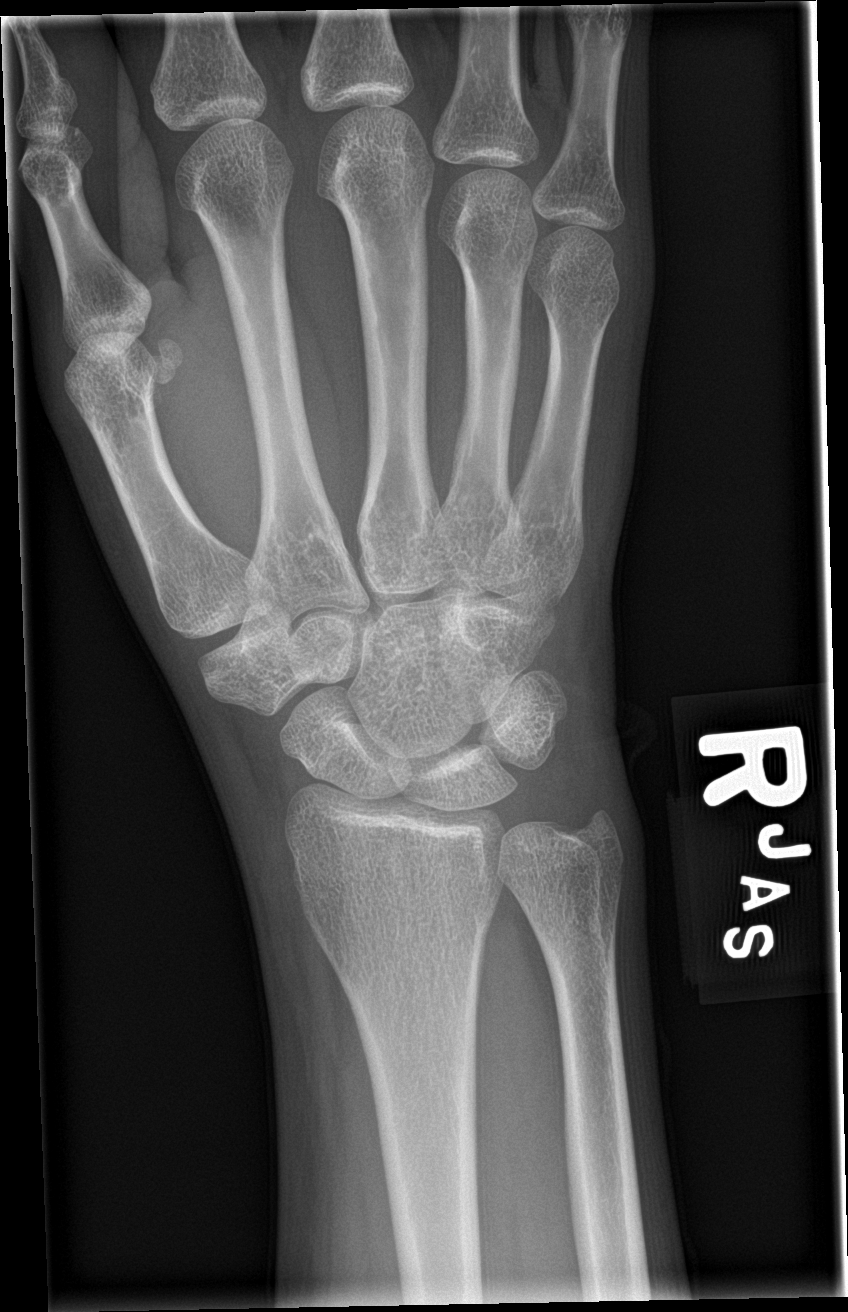
[im 2/4]
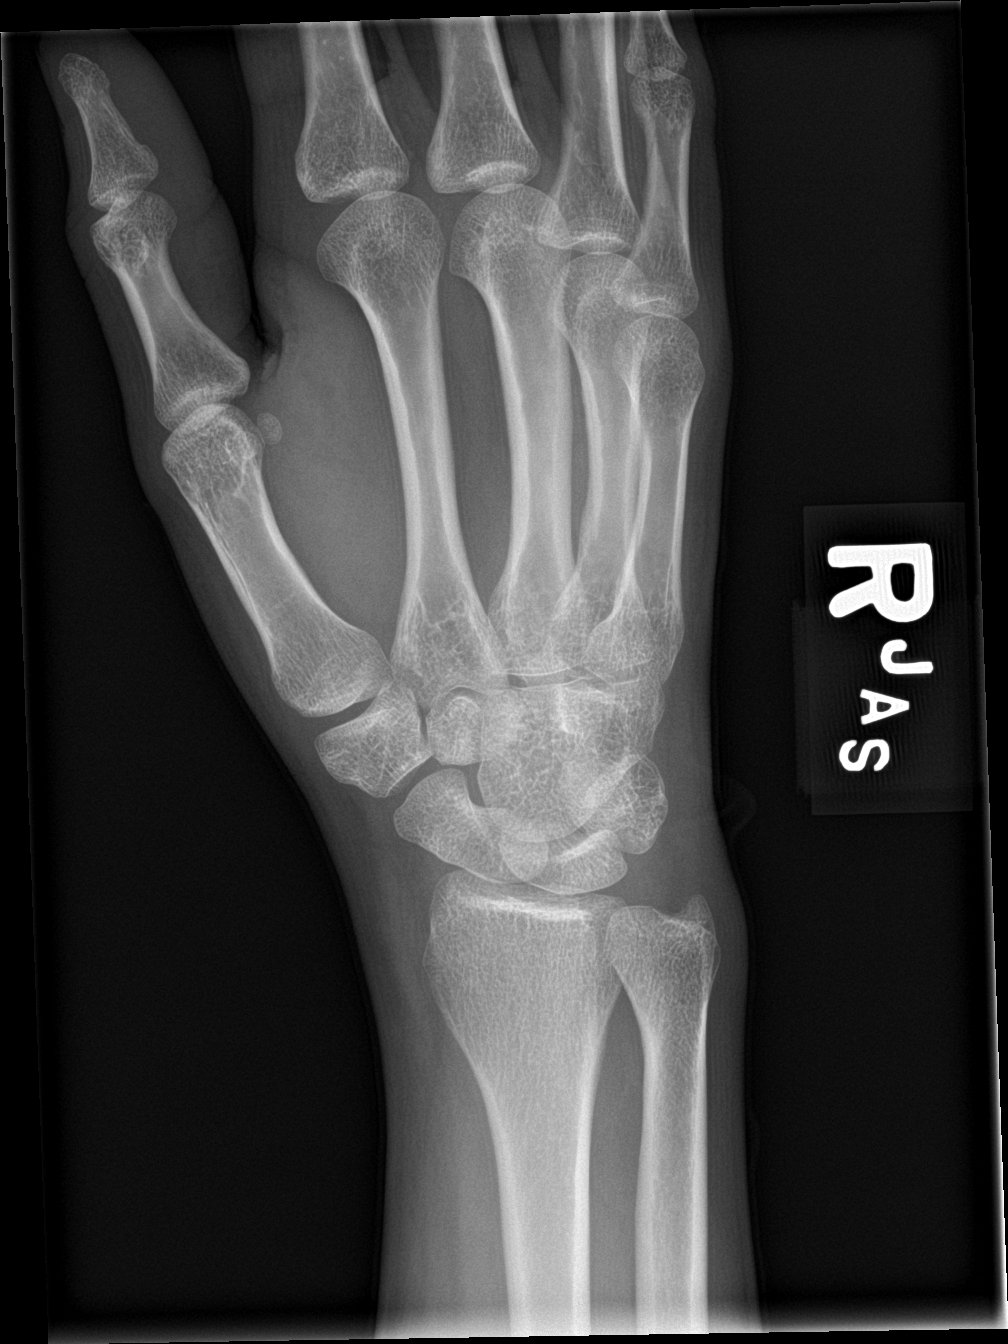
[im 3/4]
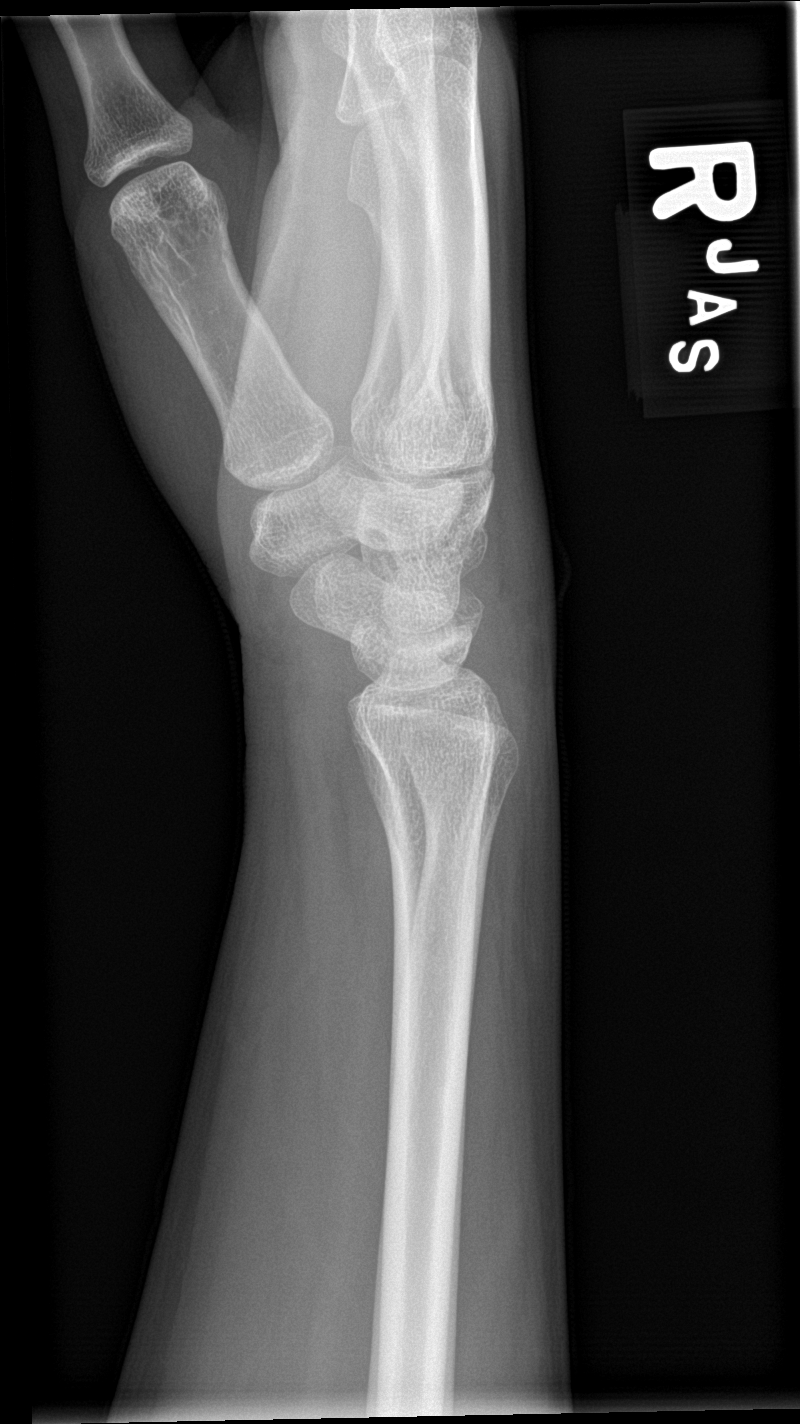
[im 4/4]
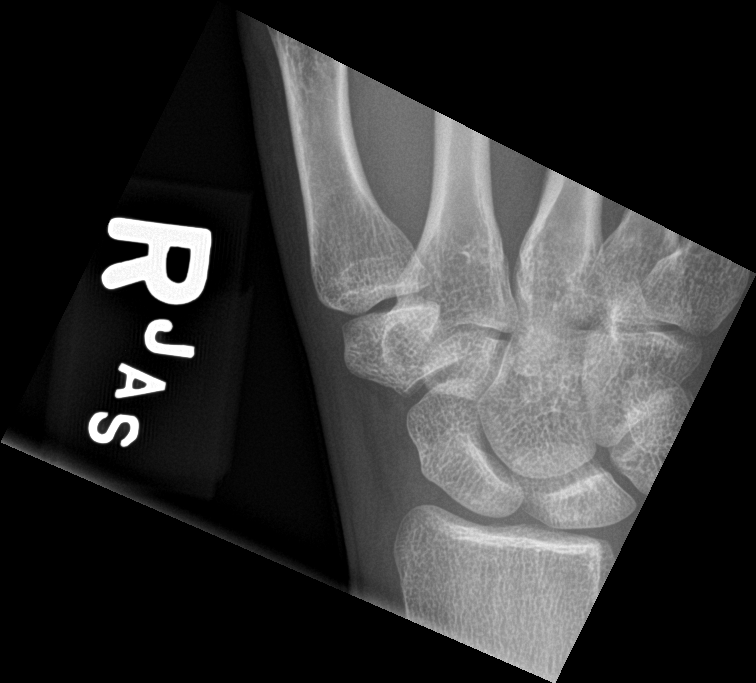

[4 of 4 positions shown; findings below may reference images not displayed]

FINDINGS: No acute fracture or dislocation. Scaphoid intact.
IMPRESSION: No acute osseous abnormality.

## 2022-01-26 IMAGING — US US OB < 14 WEEKS - US OB TV
1 series · 15 of 28 positions shown · non-contrast
Comparison: None.

CLINICAL DATA: Pain and bleeding

EXAM:
OBSTETRIC <14 WK US AND TRANSVAGINAL OB US
TECHNIQUE: Both transabdominal and transvaginal ultrasound examinations were
performed for complete evaluation of the gestation as well as the
maternal uterus, adnexal regions, and pelvic cul-de-sac.
Transvaginal technique was performed to assess early pregnancy.

[Series 1: us ob < 14 weeks - us ob tv · 15 of 70 slices shown]
[im 1/70]
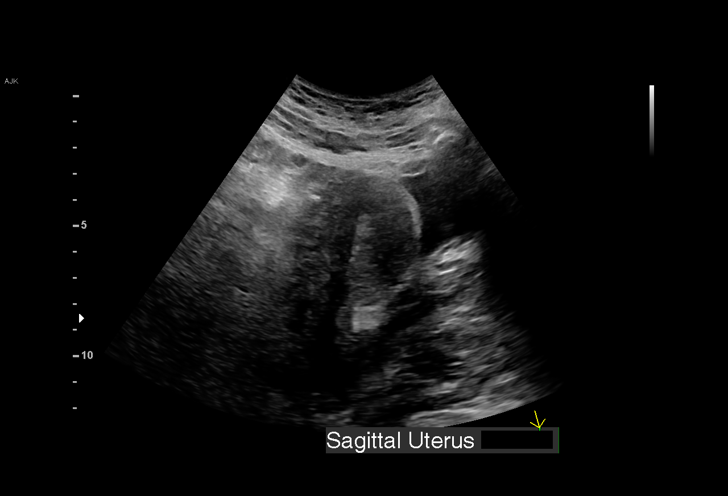
[im 6/70]
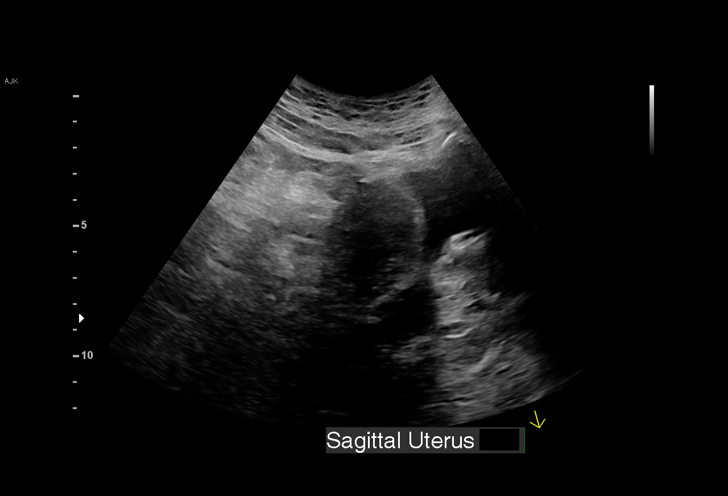
[im 11/70]
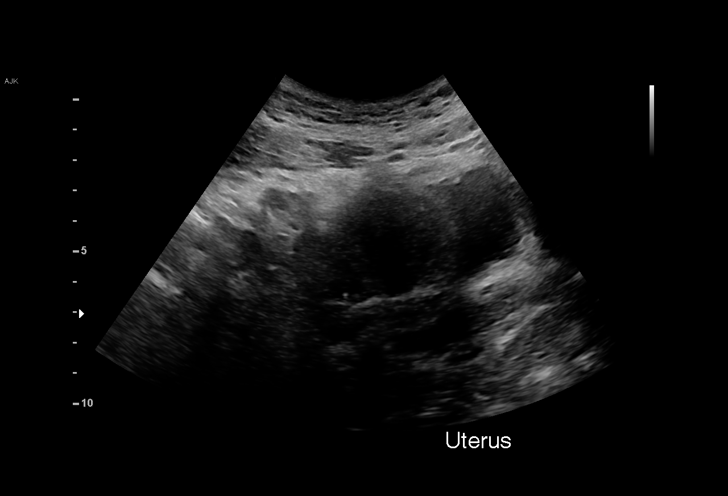
[im 16/70]
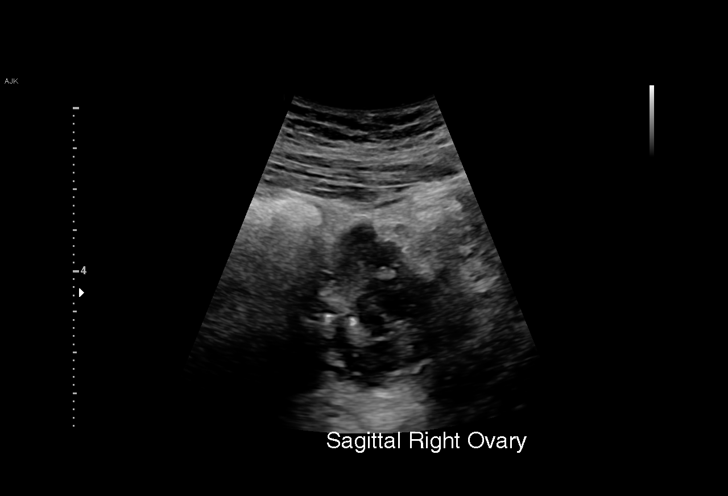
[im 21/70]
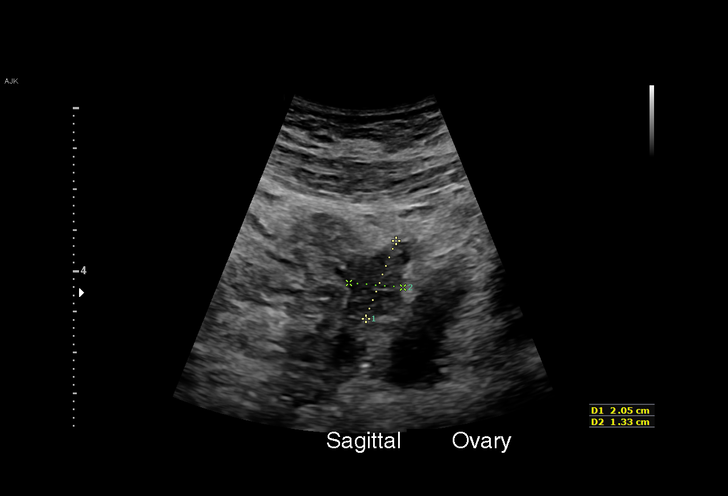
[im 26/70]
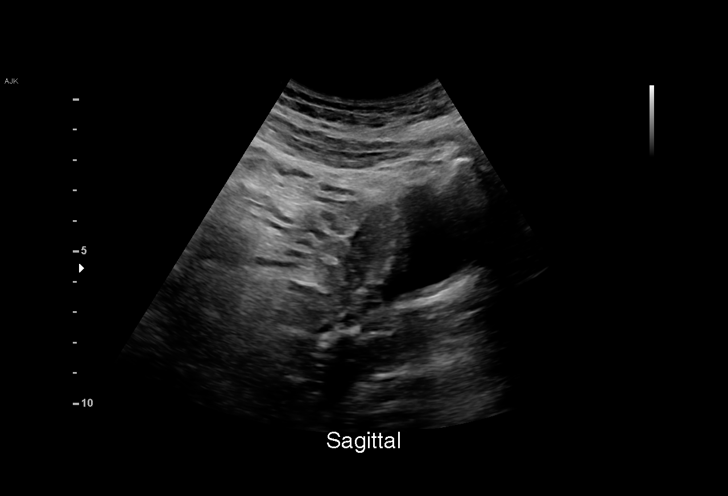
[im 31/70]
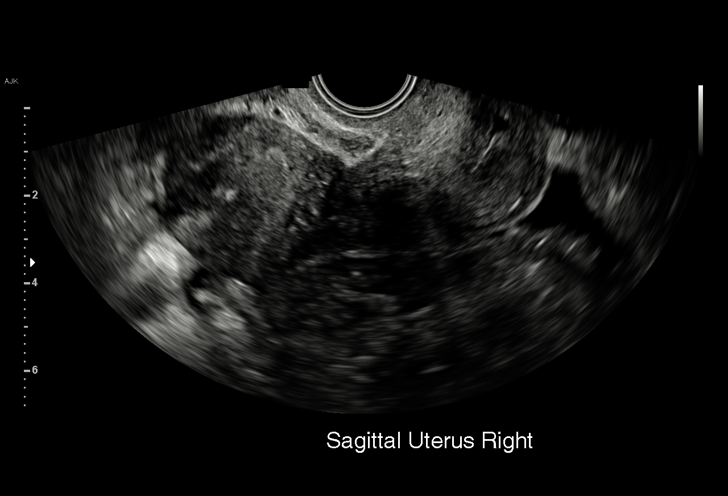
[im 36/70]
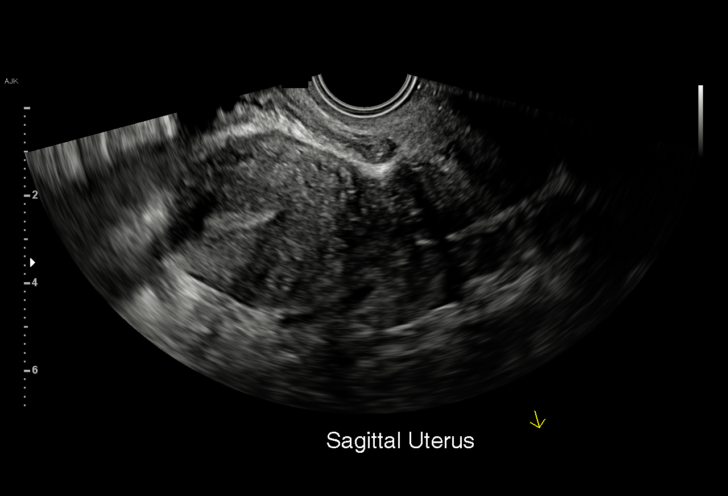
[im 39/70]
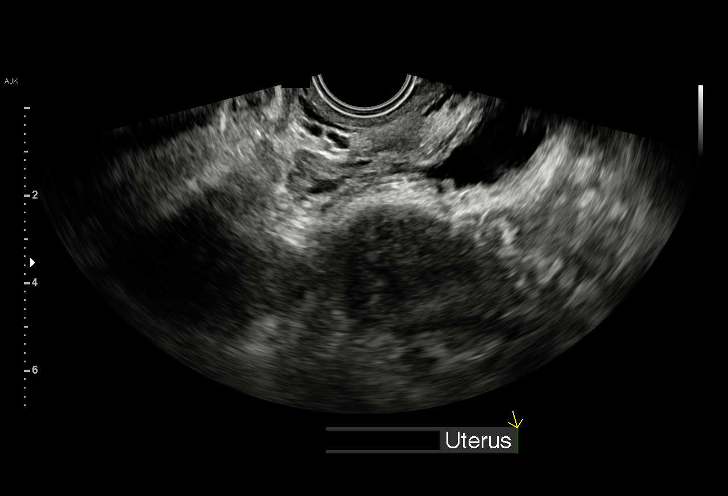
[im 44/70]
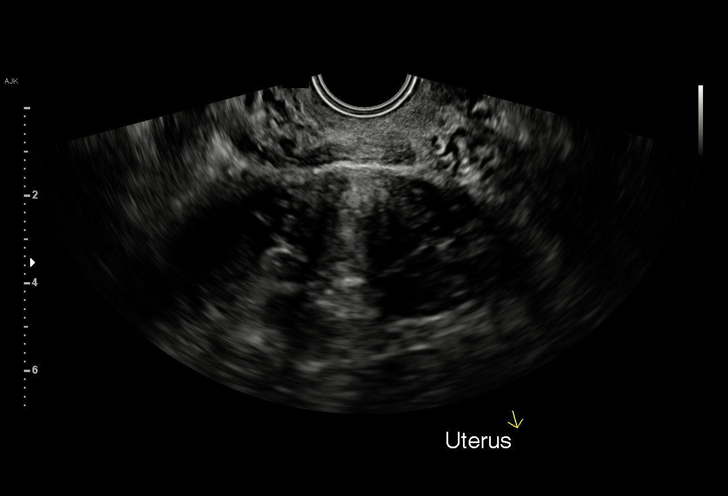
[im 49/70]
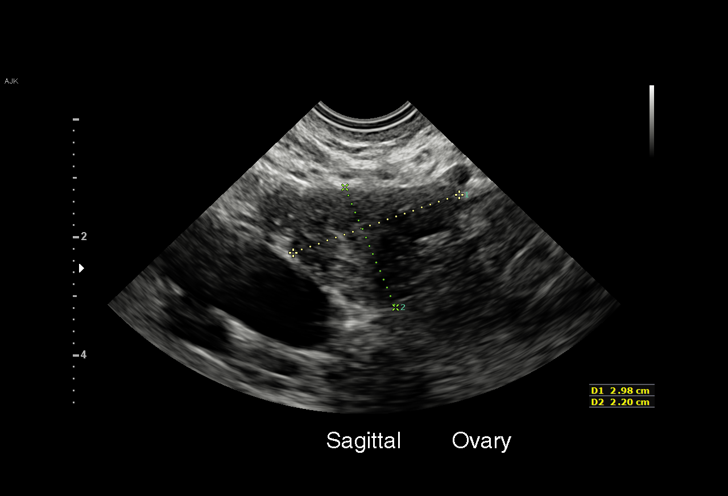
[im 54/70]
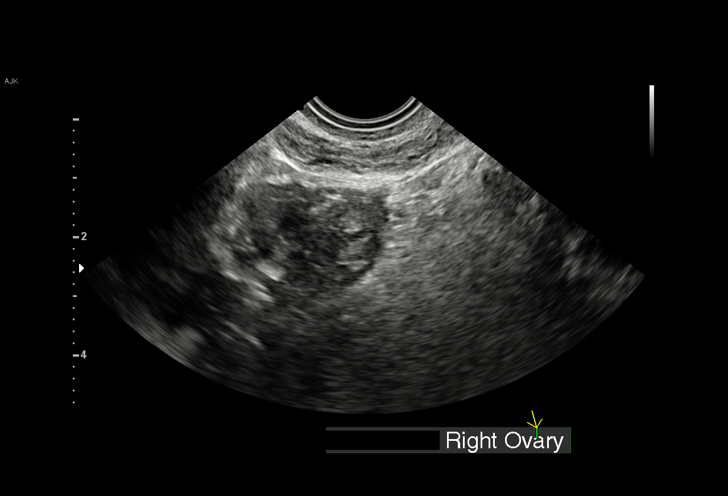
[im 59/70]
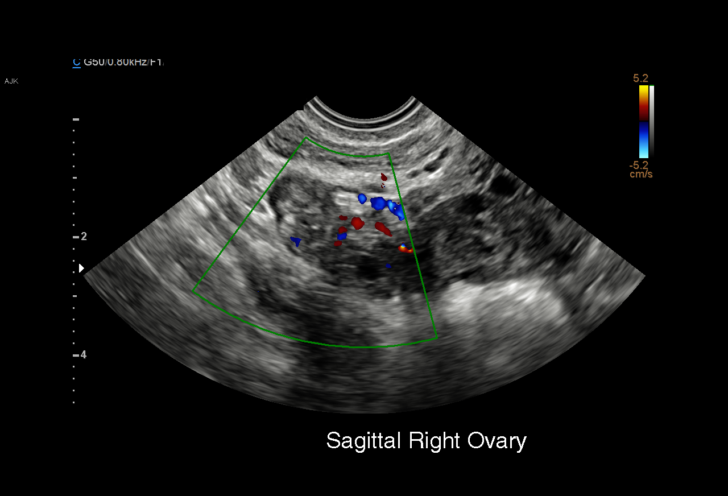
[im 64/70]
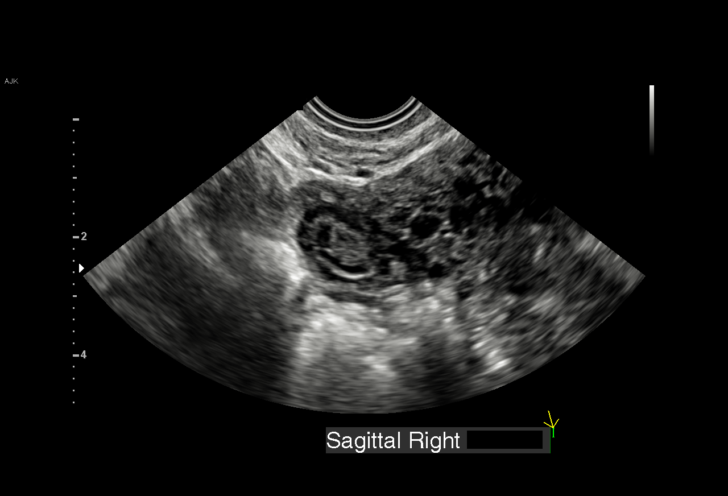
[im 70/70]
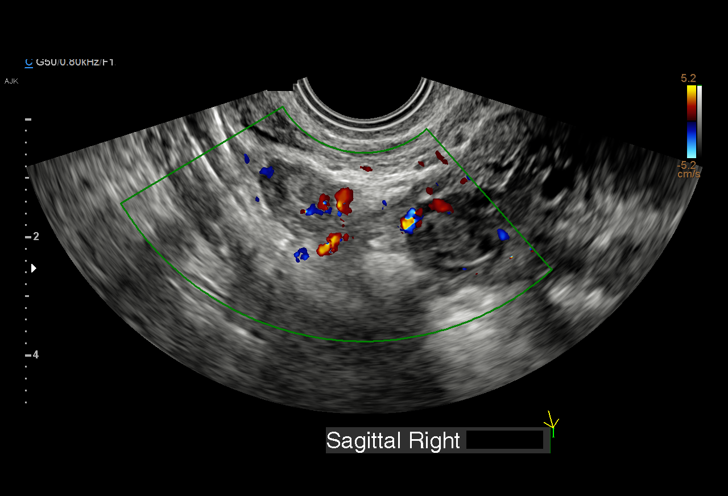

[15 of 28 positions shown; findings below may reference images not displayed]

FINDINGS: Intrauterine gestational sac: None

Yolk sac:  Not Visualized.

Subchorionic hemorrhage:  None visualized.

Maternal uterus/adnexae: Within the right adnexa adjacent to the
ovary is a heterogeneous cystic mass with a ring of vascular flow.
The mass appears to measure 2.1 x 1.4 x 2.0 cm. There are small
cystic/fluid-filled spaces seen adjacent to this mass, which appears
to be between the ovary and uterus. No free fluid is seen adjacent
to this area. The right ovary measures 2.8 x 1.2 x 2.3 cm. The left
ovary measures 3.0 x 2.2 x 2.2 cm.
IMPRESSION: Right adnexal vascular mass which appears separate from the ovary
measuring 2.1 x 1.4 x 2.0 cm. These findings are concerning for
ectopic pregnancy either within the fallopian tube or adjacent to
the ovary. Please correlate the patient's beta HCG.

Critical Value/emergent results were called by telephone at the time
who verbally acknowledged these results.

## 2022-01-29 IMAGING — US US OB TRANSVAGINAL
1 series · 15 of 28 positions shown · non-contrast
Comparison: 10/31/2019.

CLINICAL DATA: Pelvic pain.  Right ectopic pregnancy.

EXAM:
TRANSVAGINAL OB ULTRASOUND
TECHNIQUE: Transvaginal ultrasound was performed for complete evaluation of the
gestation as well as the maternal uterus, adnexal regions, and
pelvic cul-de-sac.

[Series 1: us ob transvaginal · 38 acquisitions, 15 frames shown]
[im 1/38]
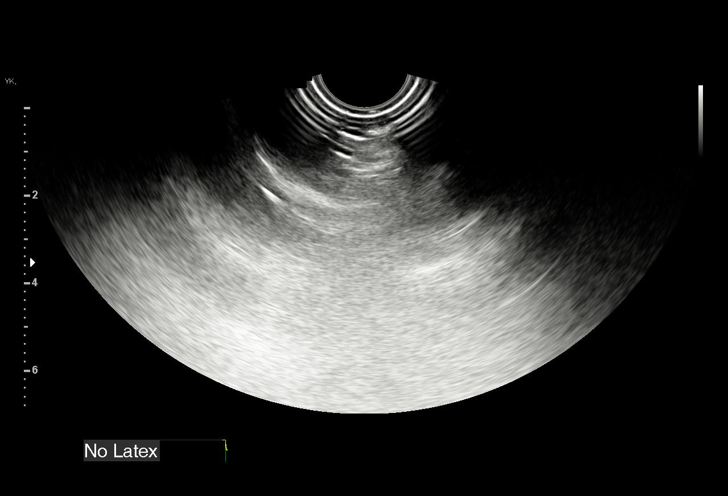
[im 3/38]
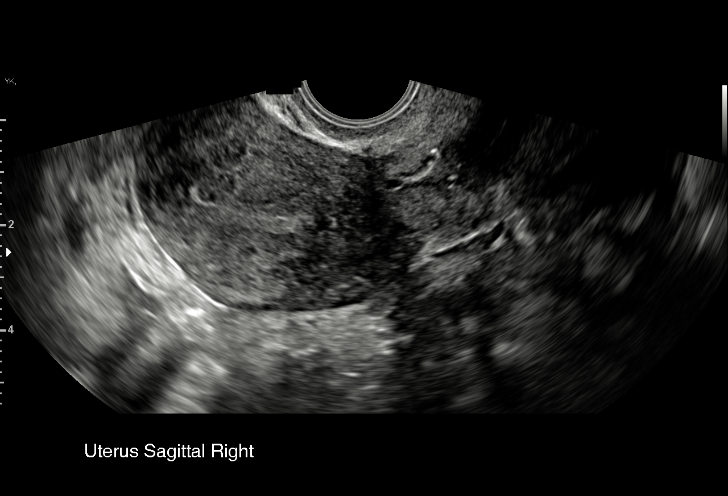
[im 6/38]
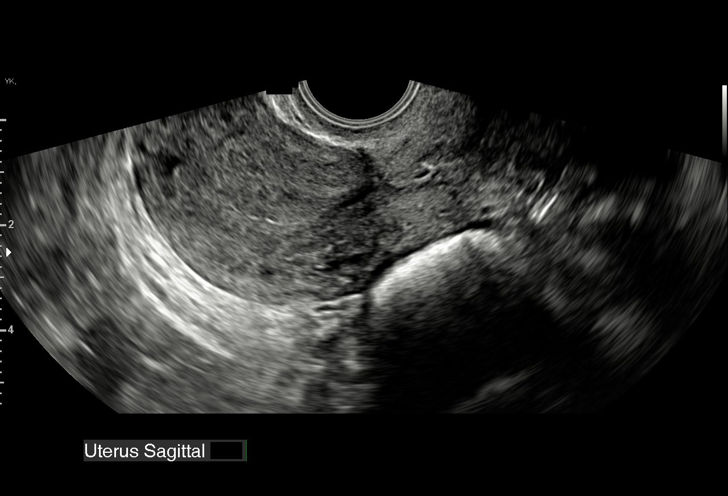
[im 9/38]
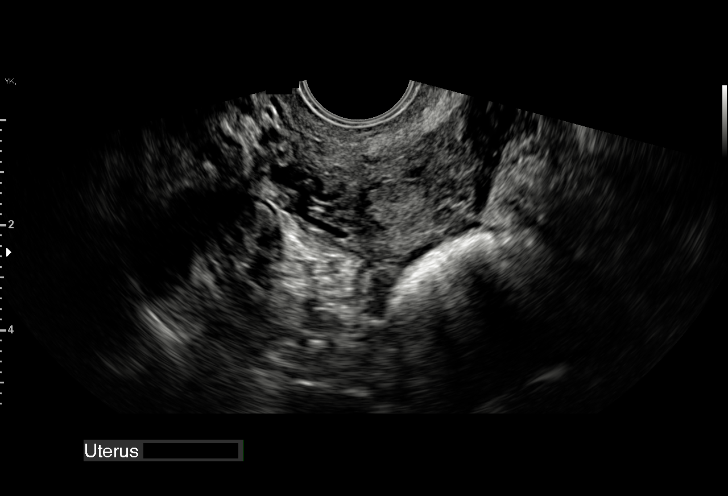
[im 11/38]
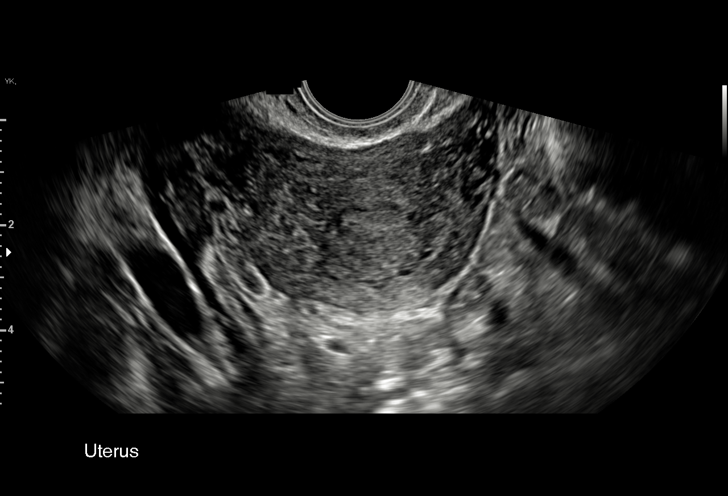
[im 14/38]
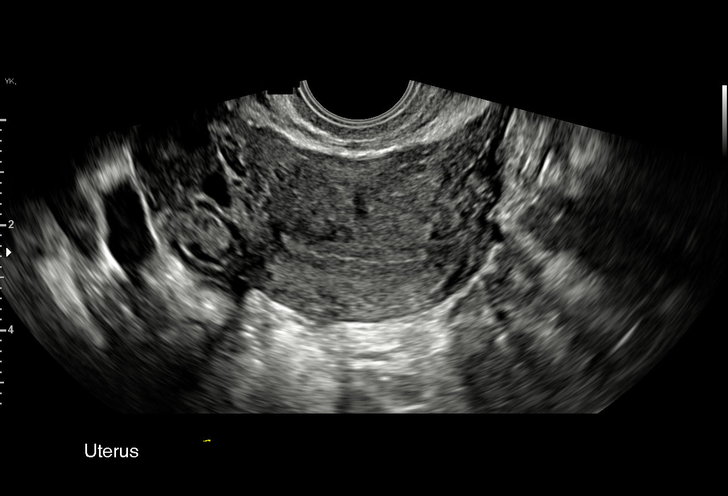
[im 17/38]
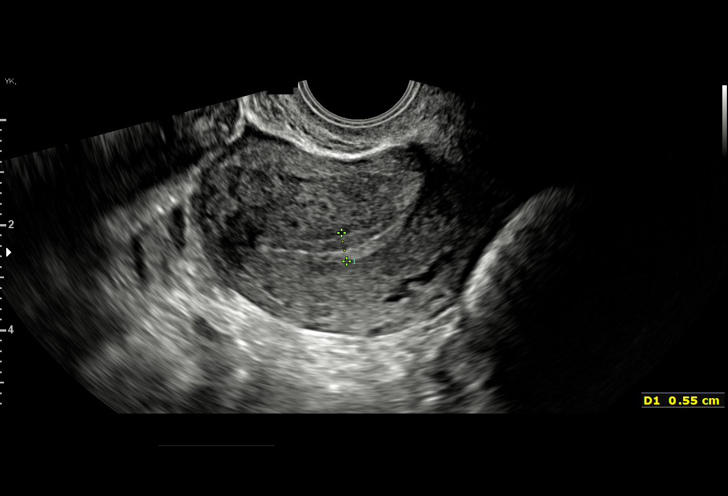
[im 20/38]
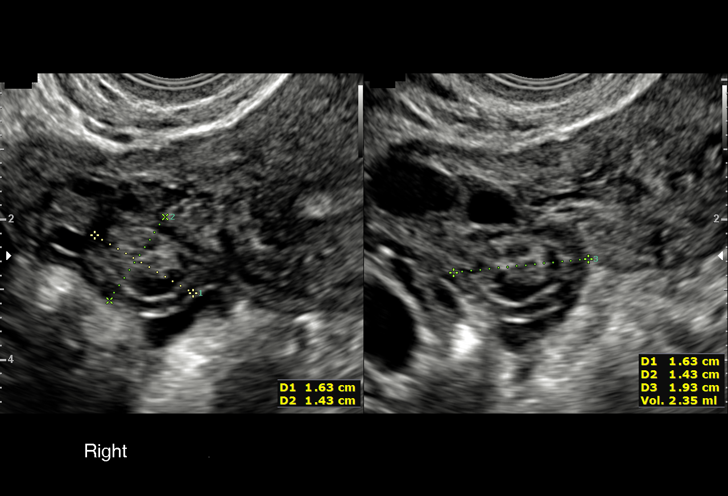
[im 21/38]
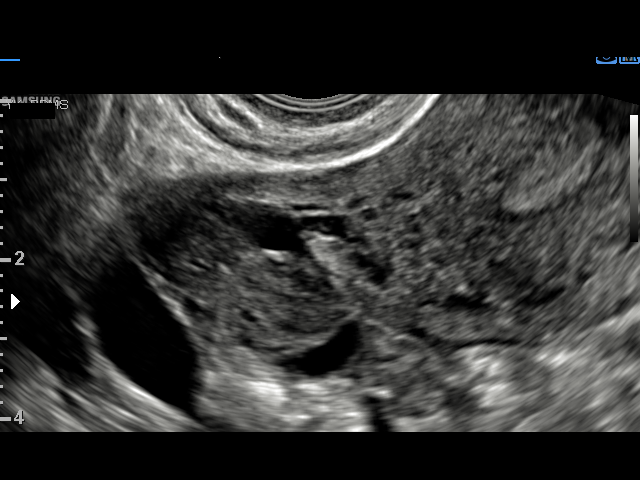
[im 24/38]
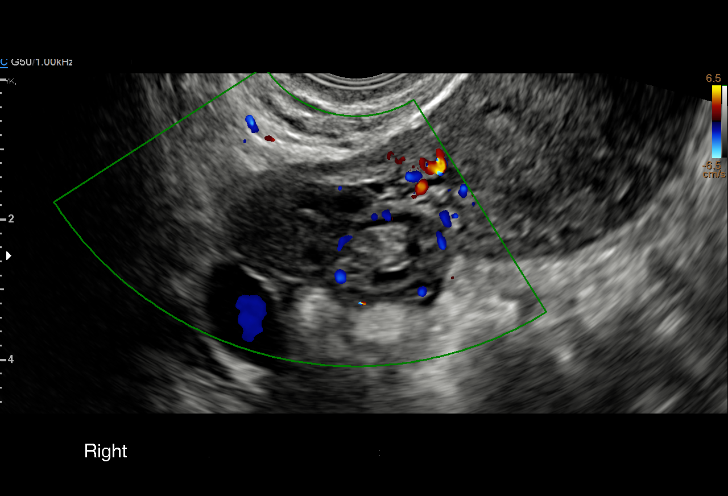
[im 27/38]
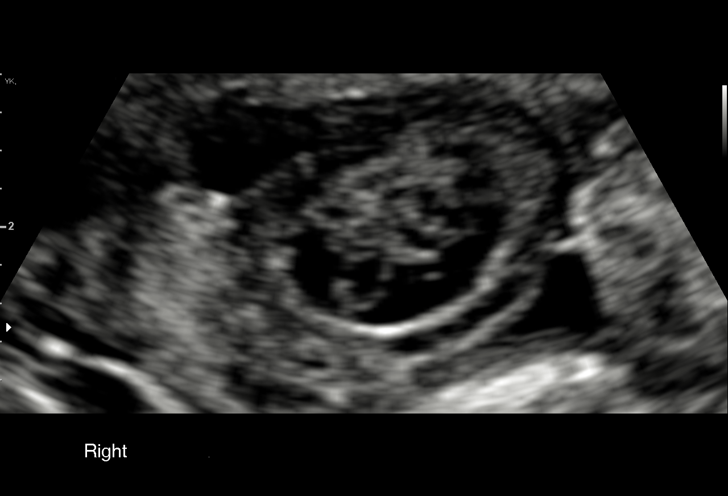
[im 29/38]
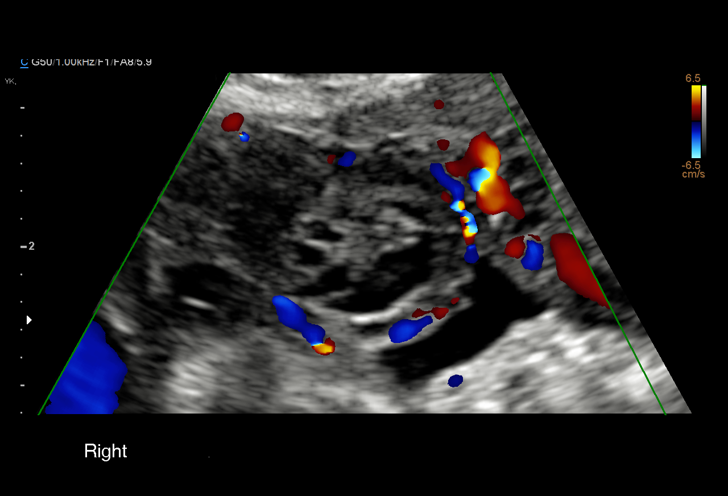
[im 32/38]
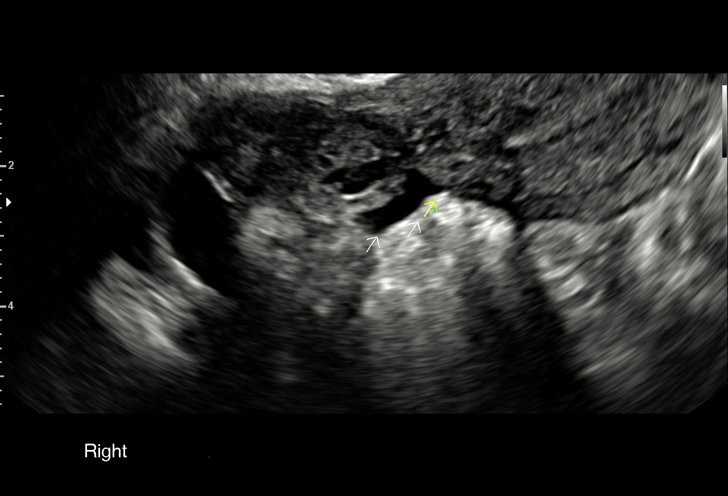
[im 35/38]
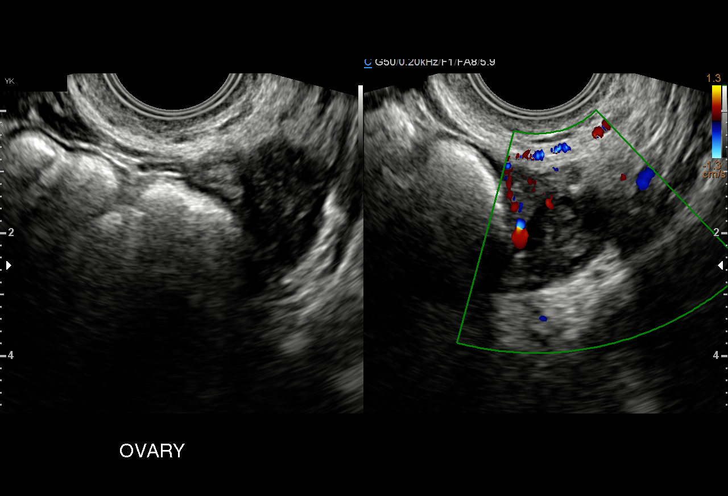
[im 38/38]
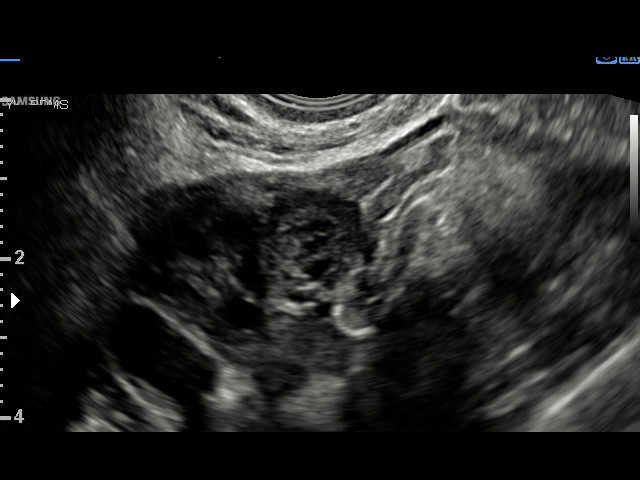

[15 of 28 positions shown; findings below may reference images not displayed]

FINDINGS: Intrauterine gestational sac: Not visualized

Yolk sac:  Not visualized

Embryo:  Not visualized

Cardiac Activity: Not visualized

Maternal uterus/adnexae: Again demonstrated is a cystic and solid
right adnexal mass with surrounding ring of vascularity with color
Doppler. No internal blood flow is visualized. This mass measures
approximately 1.9 x 1.6 x 1.4 cm today, previously approximately
x 2.0 x 1.4 cm.

Normal appearing maternal ovaries with small follicles. Trace amount
of free peritoneal fluid adjacent to the right adnexal mass.
IMPRESSION: 1. Interval slight decrease in size in the previously demonstrated
probable right adnexal ectopic pregnancy.
2. Interval trace amount of free peritoneal fluid adjacent to the
probable right adnexal ectopic pregnancy. This could represent
reactive fluid. Fluid associated with rupture of the ectopic
pregnancy is less likely based on the fact that this appears to
represent simple fluid rather than blood.

## 2022-04-23 ENCOUNTER — Ambulatory Visit (HOSPITAL_COMMUNITY)
Admission: EM | Admit: 2022-04-23 | Discharge: 2022-04-23 | Disposition: A | Discharge status: Home / Self Care | Payer: Medicaid Other | Attending: Internal Medicine | Admitting: Internal Medicine

## 2022-04-23 ENCOUNTER — Encounter (HOSPITAL_COMMUNITY): Payer: Self-pay

## 2022-04-23 DIAGNOSIS — Z202 Contact with and (suspected) exposure to infections with a predominantly sexual mode of transmission: Secondary | ICD-10-CM

## 2022-04-23 DIAGNOSIS — Z711 Person with feared health complaint in whom no diagnosis is made: Secondary | ICD-10-CM

## 2022-04-23 DIAGNOSIS — N898 Other specified noninflammatory disorders of vagina: Secondary | ICD-10-CM

## 2022-04-23 LAB — POCT URINALYSIS DIPSTICK, ED / UC
Bilirubin Urine: NEGATIVE
Glucose, UA: NEGATIVE mg/dL
Hgb urine dipstick: NEGATIVE
Ketones, ur: NEGATIVE mg/dL
Nitrite: NEGATIVE
Protein, ur: NEGATIVE mg/dL
Specific Gravity, Urine: 1.02 (ref 1.005–1.030)
Urobilinogen, UA: 1 mg/dL (ref 0.0–1.0)
pH: 7 (ref 5.0–8.0)

## 2022-04-23 MED ORDER — DOXYCYCLINE HYCLATE 100 MG PO CAPS: 100.0000 mg | ORAL_CAPSULE | Freq: Two times a day (BID) | ORAL | 0 refills | Status: AC

## 2022-04-23 NOTE — ED Triage Notes (Signed)
Pt is here for possible exposure to a chlamydia . Pt was informed by her partner. Pt stated she has been having vaginal discharge. Pt denie burning when urinating

## 2022-04-23 NOTE — Discharge Instructions (Addendum)
Urinalysis Vaginal swab pending Started Doxycycline 100 mg twice a day for 7 days. Encourage completion of your treatment even when symptoms improve.  Discussed allergic reactions with antibiotics

## 2022-04-23 NOTE — ED Provider Notes (Signed)
[facility]    CSN: [id number] Arrival date & time: 04/23/22  1613      History   Chief Complaint Chief Complaint  Patient presents with   Vaginal Discharge    HPI Debra Boyd is a 31 y.o. female.   HPI She is complaining of discharge. She reports that she was exposed to Chlamydia.  Denies any pelvic pain or tenderness, amenorrhea irregular bleeding or prolonged heavy bleeding.  Denies dysuria.  Denies ulcers or lesions  Past Medical History:  Diagnosis Date   Bacterial vaginosis    Chlamydia    Depression    Genital HSV    Gonorrhea    Ovarian cyst    Pelvic inflammatory disease    Trichomonas     Patient Active Problem List   Diagnosis Date Noted   Right tubal pregnancy without intrauterine pregnancy 10/31/2019    Past Surgical History:  Procedure Laterality Date   CESAREAN SECTION      OB History     Gravida  3   Para  1   Term  1   Preterm      AB  1   Living  1      SAB  1   IAB      Ectopic      Multiple      Live Births  1            Home Medications    Prior to Admission medications   Medication Sig Start Date End Date Taking? Authorizing Provider  doxycycline (VIBRAMYCIN) 100 MG capsule Take 1 capsule (100 mg total) by mouth 2 (two) times daily for 7 days. 04/23/22 04/30/22 Yes Barbette Merino, NP  Prenatal Vit-Fe Fumarate-FA (PRENATAL MULTIVITAMIN) TABS tablet Take 1 tablet by mouth daily at 12 noon.    [provider]  valACYclovir (VALTREX) 1000 MG tablet Take 1,000 mg by mouth daily.     [provider]  Vitamin D, Ergocalciferol, (DRISDOL) 1.25 MG (50000 UNIT) CAPS capsule Take 50,000 Units by mouth every 7 (seven) days.    [provider]    Family History Family History  Problem Relation Age of Onset   Diabetes Maternal Aunt    Kidney disease Paternal Aunt    Heart disease Maternal Grandmother    Hypertension Maternal Grandmother    Hyperlipidemia Maternal  Grandmother    Lupus Maternal Aunt    Diabetes Mother     Social History Social History   Tobacco Use   Smoking status: Former    Packs/day: 0.50    Types: Cigarettes   Smokeless tobacco: Never   Tobacco comments:    quit 3 years ago  Substance Use Topics   Alcohol use: Yes    Comment: occasionally   Drug use: Not Currently    Types: Marijuana    Comment: LAST USED 01-2017     Allergies   Patient has no known allergies.   Review of Systems Review of Systems   Physical Exam Triage Vital Signs ED Triage Vitals  Enc Vitals Group     BP 04/23/22 1702 124/76     Pulse Rate 04/23/22 1702 63     Resp 04/23/22 1702 12     Temp 04/23/22 1702 97.7 F (36.5 C)     Temp Source 04/23/22 1702 Oral     SpO2 04/23/22 1702 100 %     Weight 04/23/22 1659 147 lb (66.7 kg)     Height 04/23/22 1659  5\' 2"  (1.575 m)     Head Circumference --      Peak Flow --      Pain Score 04/23/22 1659 0     Pain Loc --      Pain Edu? --      Excl. in GC? --    No data found.  Updated Vital Signs BP 124/76 (BP Location: Left Arm)   Pulse 63   Temp 97.7 F (36.5 C) (Oral)   Resp 12   Ht 5\' 2"  (1.575 m)   Wt 147 lb (66.7 kg)   LMP  (LMP Unknown)   SpO2 100%   BMI 26.89 kg/m   Visual Acuity Right Eye Distance:   Left Eye Distance:   Bilateral Distance:    Right Eye Near:   Left Eye Near:    Bilateral Near:     Physical Exam HENT:     Head: Normocephalic.  Cardiovascular:     Rate and Rhythm: Normal rate.  Pulmonary:     Effort: Pulmonary effort is normal.  Musculoskeletal:        General: Normal range of motion.  Skin:    General: Skin is warm and dry.     Capillary Refill: Capillary refill takes less than 2 seconds.  Neurological:     General: No focal deficit present.     Mental Status: She is alert and oriented to person, place, and time.      UC Treatments / Results  Labs (all labs ordered are listed, but only abnormal results are displayed) Labs Reviewed   POCT URINALYSIS DIPSTICK, ED / UC - Abnormal; Notable for the following components:      Result Value   Leukocytes,Ua TRACE (*)    All other components within normal limits  URINE CULTURE  CERVICOVAGINAL ANCILLARY ONLY    EKG   Radiology No results found.  Procedures Procedures (including critical care time)  Medications Ordered in UC Medications - No data to display  Initial Impression / Assessment and Plan / UC Course  I have reviewed the triage vital signs and the nursing notes.  Pertinent labs & imaging results that were available during my care of the patient were reviewed by me and considered in my medical decision making (see chart for details).     Exposure to STD  Final Clinical Impressions(s) / UC Diagnoses   Final diagnoses:  Concern about STD in female without diagnosis  Vaginal discharge     Discharge Instructions      Urinalysis Vaginal swab pending Started Doxycycline 100 mg twice a day for 7 days. Encourage completion of your treatment even when symptoms improve.  Discussed allergic reactions with antibiotics           ED Prescriptions     Medication Sig Dispense Auth. Provider   doxycycline (VIBRAMYCIN) 100 MG capsule Take 1 capsule (100 mg total) by mouth 2 (two) times daily for 7 days. 14 capsule 04/25/22, NP      PDMP not reviewed this encounter.   [place], Thad Ranger 04/23/22 [phone]

## 2022-04-28 ENCOUNTER — Telehealth (HOSPITAL_COMMUNITY): Payer: Self-pay | Admitting: Emergency Medicine

## 2022-04-28 NOTE — Telephone Encounter (Signed)
Receibed a voicemail from patient looking for results of Cyto swab from recent visit.  I see a cancelled order in system.  Called to update patient that she will need to return for recollect, LVM

## 2022-04-29 ENCOUNTER — Ambulatory Visit (HOSPITAL_COMMUNITY)
Admission: EM | Admit: 2022-04-29 | Discharge: 2022-04-29 | Disposition: A | Discharge status: Home / Self Care | Payer: Self-pay | Attending: Internal Medicine | Admitting: Internal Medicine

## 2022-04-29 DIAGNOSIS — N760 Acute vaginitis: Secondary | ICD-10-CM | POA: Insufficient documentation

## 2022-04-29 NOTE — ED Triage Notes (Signed)
Pt received a call that her vaginal swab did not result into epic. She is here for a recollect. Pt instructed to finish out her medications.

## 2022-04-30 ENCOUNTER — Telehealth (HOSPITAL_COMMUNITY): Payer: Self-pay | Admitting: Emergency Medicine

## 2022-04-30 LAB — CERVICOVAGINAL ANCILLARY ONLY
Bacterial Vaginitis (gardnerella): POSITIVE — AB
Candida Glabrata: NEGATIVE
Candida Vaginitis: NEGATIVE
Chlamydia: NEGATIVE
Comment: NEGATIVE
Comment: NEGATIVE
Comment: NEGATIVE
Comment: NEGATIVE
Comment: NEGATIVE
Comment: NORMAL
Neisseria Gonorrhea: NEGATIVE
Trichomonas: NEGATIVE

## 2022-04-30 MED ORDER — METRONIDAZOLE 500 MG PO TABS: 500.0000 mg | ORAL_TABLET | Freq: Two times a day (BID) | ORAL | 0 refills | Status: AC

## 2023-05-06 ENCOUNTER — Other Ambulatory Visit: Payer: Self-pay | Admitting: Nurse Practitioner

## 2023-05-06 DIAGNOSIS — Z840 Family history of diseases of the skin and subcutaneous tissue: Secondary | ICD-10-CM

## 2023-05-06 DIAGNOSIS — L0291 Cutaneous abscess, unspecified: Secondary | ICD-10-CM

## 2023-05-06 MED ORDER — DOXYCYCLINE HYCLATE 100 MG PO TABS: 100.0000 mg | ORAL_TABLET | Freq: Two times a day (BID) | ORAL | 1 refills | Status: DC

## 2023-05-12 LAB — ANAEROBIC/AEROBIC/GRAM STAIN

## 2023-05-15 LAB — COMPREHENSIVE METABOLIC PANEL
ALT: 14 IU/L (ref 0–32)
AST: 17 IU/L (ref 0–40)
Albumin: 4.4 g/dL (ref 3.9–4.9)
Alkaline Phosphatase: 89 IU/L (ref 44–121)
BUN/Creatinine Ratio: 18 (ref 9–23)
BUN: 14 mg/dL (ref 6–20)
Bilirubin Total: 0.8 mg/dL (ref 0.0–1.2)
CO2: 26 mmol/L (ref 20–29)
Calcium: 9.8 mg/dL (ref 8.7–10.2)
Chloride: 102 mmol/L (ref 96–106)
Creatinine, Ser: 0.77 mg/dL (ref 0.57–1.00)
Globulin, Total: 3.2 g/dL (ref 1.5–4.5)
Glucose: 74 mg/dL (ref 70–99)
Potassium: 4.1 mmol/L (ref 3.5–5.2)
Sodium: 140 mmol/L (ref 134–144)
Total Protein: 7.6 g/dL (ref 6.0–8.5)
eGFR: 105 mL/min/{1.73_m2} (ref >59)

## 2023-05-15 LAB — CBC WITH DIFFERENTIAL/PLATELET
Basophils Absolute: 0 10*3/uL (ref 0.0–0.2)
Basos: 0 %
EOS (ABSOLUTE): 0.2 10*3/uL (ref 0.0–0.4)
Eos: 3 %
Hematocrit: 35.5 % (ref 34.0–46.6)
Hemoglobin: 12 g/dL (ref 11.1–15.9)
Immature Grans (Abs): 0 10*3/uL (ref 0.0–0.1)
Immature Granulocytes: 0 %
Lymphocytes Absolute: 3 10*3/uL (ref 0.7–3.1)
Lymphs: 33 %
MCH: 32.6 pg (ref 26.6–33.0)
MCHC: 33.8 g/dL (ref 31.5–35.7)
MCV: 97 fL (ref 79–97)
Monocytes Absolute: 0.6 10*3/uL (ref 0.1–0.9)
Monocytes: 7 %
Neutrophils Absolute: 5.1 10*3/uL (ref 1.4–7.0)
Neutrophils: 57 %
Platelets: 292 10*3/uL (ref 150–450)
RBC: 3.68 x10E6/uL — ABNORMAL LOW (ref 3.77–5.28)
RDW: 10.7 % — ABNORMAL LOW (ref 11.7–15.4)
WBC: 9 10*3/uL (ref 3.4–10.8)

## 2023-05-15 LAB — TSH: TSH: 1.18 u[IU]/mL (ref 0.450–4.500)

## 2023-05-15 LAB — TESTOSTERONE, FREE AND TOTAL (INCLUDES SHBG)-(MALES)
% Free Testosterone: 0.9 %
Free Testosterone, S: 1.4 pg/mL
Sex Hormone Binding Globulin: 67.9 nmol/L
Testosterone, Serum (Total): 16 ng/dL

## 2023-05-15 LAB — FSH/LH
FSH: 6.8 m[IU]/mL
LH: 10.8 m[IU]/mL

## 2023-05-15 LAB — HEMOGLOBIN A1C
Est. average glucose Bld gHb Est-mCnc: 94 mg/dL
Hgb A1c MFr Bld: 4.9 % (ref 4.8–5.6)

## 2023-05-15 LAB — ANTINUCLEAR ANTIBODIES, IFA: ANA Titer 1: NEGATIVE

## 2023-05-15 LAB — SEDIMENTATION RATE: Sed Rate: 5 mm/h (ref 0–32)

## 2023-06-10 ENCOUNTER — Other Ambulatory Visit: Payer: Self-pay | Admitting: Nurse Practitioner

## 2023-06-10 DIAGNOSIS — L0291 Cutaneous abscess, unspecified: Secondary | ICD-10-CM

## 2023-06-10 MED ORDER — DOXYCYCLINE HYCLATE 100 MG PO TABS: 100.0000 mg | ORAL_TABLET | Freq: Two times a day (BID) | ORAL | 1 refills | Status: AC

## 2023-06-10 MED ORDER — CLINDAMYCIN PHOSPHATE 1 % EX GEL: Freq: Two times a day (BID) | CUTANEOUS | 0 refills | Status: AC
